# Patient Record
Sex: Male | Born: 1970 | Race: Black or African American | Hispanic: No | Marital: Single | Smoking: Former smoker
Health system: Southern US, Community
[De-identification: ages and names within clinical notes are randomized; demographics above are authoritative.]

## PROBLEM LIST (undated history)

## (undated) DIAGNOSIS — N401 Enlarged prostate with lower urinary tract symptoms: Secondary | ICD-10-CM

## (undated) DIAGNOSIS — K219 Gastro-esophageal reflux disease without esophagitis: Secondary | ICD-10-CM

## (undated) DIAGNOSIS — T7840XA Allergy, unspecified, initial encounter: Secondary | ICD-10-CM

## (undated) DIAGNOSIS — R0789 Other chest pain: Secondary | ICD-10-CM

## (undated) DIAGNOSIS — N138 Other obstructive and reflux uropathy: Secondary | ICD-10-CM

## (undated) HISTORY — DX: Benign prostatic hyperplasia with lower urinary tract symptoms: N40.1

## (undated) HISTORY — PX: HERNIA REPAIR: SHX51

## (undated) HISTORY — DX: Other obstructive and reflux uropathy: N13.8

## (undated) HISTORY — PX: TONSILLECTOMY: SUR1361

## (undated) HISTORY — DX: Other chest pain: R07.89

## (undated) HISTORY — DX: Gastro-esophageal reflux disease without esophagitis: K21.9

## (undated) HISTORY — DX: Allergy, unspecified, initial encounter: T78.40XA

---

## 2005-05-17 ENCOUNTER — Emergency Department: Payer: Self-pay | Admitting: Emergency Medicine

## 2005-05-17 ENCOUNTER — Other Ambulatory Visit: Payer: Self-pay

## 2005-11-16 ENCOUNTER — Emergency Department: Payer: Self-pay | Admitting: Emergency Medicine

## 2009-10-06 ENCOUNTER — Ambulatory Visit: Payer: Self-pay | Admitting: Internal Medicine

## 2009-10-06 DIAGNOSIS — N400 Enlarged prostate without lower urinary tract symptoms: Secondary | ICD-10-CM | POA: Insufficient documentation

## 2009-10-06 DIAGNOSIS — J309 Allergic rhinitis, unspecified: Secondary | ICD-10-CM | POA: Insufficient documentation

## 2009-10-06 DIAGNOSIS — K219 Gastro-esophageal reflux disease without esophagitis: Secondary | ICD-10-CM | POA: Insufficient documentation

## 2009-10-06 LAB — CONVERTED CEMR LAB
Basophils Relative: 0.3 % (ref 0.0–3.0)
CK-MB: 1 ng/mL (ref 0.3–4.0)
Eosinophils Relative: 1.4 % (ref 0.0–5.0)
HDL: 45.4 mg/dL (ref >39.00)
MCV: 95.5 fL (ref 78.0–100.0)
Monocytes Absolute: 0.6 10*3/uL (ref 0.1–1.0)
Monocytes Relative: 6.3 % (ref 3.0–12.0)
Neutrophils Relative %: 69.1 % (ref 43.0–77.0)
PSA: 0.53 ng/mL (ref 0.10–4.00)
Platelets: 188 10*3/uL (ref 150.0–400.0)
RBC: 4.85 M/uL (ref 4.22–5.81)
Total CHOL/HDL Ratio: 4
VLDL: 26 mg/dL (ref 0.0–40.0)
WBC: 9.8 10*3/uL (ref 4.5–10.5)

## 2009-10-11 ENCOUNTER — Ambulatory Visit: Payer: Self-pay | Admitting: Internal Medicine

## 2009-10-19 ENCOUNTER — Ambulatory Visit: Payer: Self-pay | Admitting: Internal Medicine

## 2009-10-25 ENCOUNTER — Telehealth: Payer: Self-pay | Admitting: Internal Medicine

## 2009-12-15 ENCOUNTER — Telehealth: Payer: Self-pay | Admitting: Internal Medicine

## 2010-01-24 ENCOUNTER — Ambulatory Visit: Payer: Self-pay | Admitting: Internal Medicine

## 2010-06-13 NOTE — Assessment & Plan Note (Signed)
Summary: 3 mos f/u #/cd   Vital Signs:  Patient profile:   40 year old male Height:      66 inches Weight:      197.50 pounds BMI:     31.99 O2 Sat:      97 % on Room air Temp:     97.7 degrees F oral Pulse rate:   64 / minute Pulse rhythm:   regular Resp:     16 per minute BP sitting:   112 / 70  (left arm)  Vitals Entered By: Rock Nephew CMA (January 24, 2010 10:17 AM)  Nutrition Counseling: Patient's BMI is greater than 25 and therefore counseled on weight management options.  O2 Flow:  Room air  Primary Care Provider:  Etta Grandchild MD   History of Present Illness: He feels well and returns for a f/up on GERD.  Dyspepsia History:      He has no alarm features of dyspepsia including no history of melena, hematochezia, dysphagia, persistent vomiting, or involuntary weight loss > 5%.  There is a prior history of GERD.  The patient does not have a prior history of documented ulcer disease.  The dominant symptom is heartburn or acid reflux.  An H-2 blocker medication is currently being taken.  He notes that the symptoms have improved with the H-2 blocker therapy.  Symptoms have not persisted after 4 weeks of H-2 blocker treatment.     Preventive Screening-Counseling & Management  Alcohol-Tobacco     Alcohol drinks/day: 2     Alcohol type: spirits     >5/day in last 3 mos: no     Alcohol Counseling: to decrease amount and/or frequency of alcohol intake     Feels need to cut down: no     Feels annoyed by complaints: no     Feels guilty re: drinking: no     Needs 'eye opener' in am: no     Smoking Status: quit < 6 months     Smoking Cessation Counseling: yes     Smoke Cessation Stage: quit     Packs/Day: 0.5     Year Started: 1997     Year Quit: 2011     Pack years: 10     Tobacco Counseling: to remain off tobacco products  Hep-HIV-STD-Contraception     Hepatitis Risk: no risk noted     HIV Risk: no risk noted     STD Risk: no risk noted     TSE monthly:  yes     Testicular SE Education/Counseling not indicated; STE done regularly  Clinical Review Panels:  CBC   WBC:  9.8 (10/06/2009)   RBC:  4.85 (10/06/2009)   Hgb:  15.6 (10/06/2009)   Hct:  46.3 (10/06/2009)   Platelets:  188.0 (10/06/2009)   MCV  95.5 (10/06/2009)   MCHC  33.7 (10/06/2009)   RDW  14.0 (10/06/2009)   PMN:  69.1 (10/06/2009)   Lymphs:  22.9 (10/06/2009)   Monos:  6.3 (10/06/2009)   Eosinophils:  1.4 (10/06/2009)   Basophil:  0.3 (10/06/2009)   Medications Prior to Update: 1)  Omeprazole 40 Mg Cpdr (Omeprazole) .... One By Mouth Once Daily  Current Medications (verified): 1)  Omeprazole 40 Mg Cpdr (Omeprazole) .... One By Mouth Once Daily  Allergies (verified): No Known Drug Allergies  Past History:  Past Medical History: Last updated: 10/06/2009 Allergic rhinitis GERD  Past Surgical History: Last updated: 10/06/2009 Inguinal herniorrhaphy Tonsillectomy  Family History: Last updated: 10/06/2009 Family  History of Colon CA 1st degree relative <60 Family History Diabetes 1st degree relative Family History Hypertension Family History of Stroke M 1st degree relative <50  Social History: Last updated: 10/06/2009 Occupation: Aeronautical engineer Single Alcohol use-yes Drug use-yes Regular exercise-yes  Risk Factors: Alcohol Use: 2 (01/24/2010) >5 drinks/d w/in last 3 months: no (01/24/2010) Exercise: yes (10/06/2009)  Risk Factors: Smoking Status: quit < 6 months (01/24/2010) Packs/Day: 0.5 (01/24/2010)  Family History: Reviewed history from 10/06/2009 and no changes required. Family History of Colon CA 1st degree relative <60 Family History Diabetes 1st degree relative Family History Hypertension Family History of Stroke M 1st degree relative <50  Social History: Reviewed history from 10/06/2009 and no changes required. Occupation: Aeronautical engineer Single Alcohol use-yes Drug use-yes Regular exercise-yes  Review of Systems  The  patient denies anorexia, chest pain, dyspnea on exertion, prolonged cough, abdominal pain, melena, hematochezia, severe indigestion/heartburn, suspicious skin lesions, and enlarged lymph nodes.    Physical Exam  General:  alert, well-developed, well-nourished, well-hydrated, appropriate dress, normal appearance, healthy-appearing, cooperative to examination, and good hygiene.   Mouth:  Oral mucosa and oropharynx without lesions or exudates.  Teeth in good repair. Neck:  supple, full ROM, no masses, no thyromegaly, no thyroid nodules or tenderness, no JVD, normal carotid upstroke, no carotid bruits, no cervical lymphadenopathy, and no neck tenderness.   Lungs:  Normal respiratory effort, chest expands symmetrically. Lungs are clear to auscultation, no crackles or wheezes. Heart:  Normal rate and regular rhythm. S1 and S2 normal without gallop, murmur, click, rub or other extra sounds. Abdomen:  soft, non-tender, normal bowel sounds, no distention, no masses, no guarding, no rigidity, no rebound tenderness, no abdominal hernia, no inguinal hernia, no hepatomegaly, and no splenomegaly.   Msk:  normal ROM, no joint tenderness, no joint swelling, no joint warmth, no redness over joints, no joint deformities, no joint instability, no crepitation, and no muscle atrophy.   Pulses:  R and L carotid,radial,femoral,dorsalis pedis and posterior tibial pulses are full and equal bilaterally Extremities:  No clubbing, cyanosis, edema, or deformity noted with normal full range of motion of all joints.   Neurologic:  No cranial nerve deficits noted. Station and gait are normal. Plantar reflexes are down-going bilaterally. DTRs are symmetrical throughout. Sensory, motor and coordinative functions appear intact. Skin:  turgor normal, color normal, no rashes, no suspicious lesions, no ecchymoses, no petechiae, no purpura, no ulcerations, no edema, and tattoo(s).   Cervical Nodes:  no anterior cervical adenopathy and no  posterior cervical adenopathy.   Psych:  Cognition and judgment appear intact. Alert and cooperative with normal attention span and concentration. No apparent delusions, illusions, hallucinations   Impression & Recommendations:  Problem # 1:  GERD (ICD-530.81) Assessment Improved  His updated medication list for this problem includes:    Omeprazole 40 Mg Cpdr (Omeprazole) ..... One by mouth once daily  Labs Reviewed: Hgb: 15.6 (10/06/2009)   Hct: 46.3 (10/06/2009)  Complete Medication List: 1)  Omeprazole 40 Mg Cpdr (Omeprazole) .... One by mouth once daily  Patient Instructions: 1)  Please schedule a follow-up appointment in 6 months. 2)  Avoid foods high in acid (tomatoes, citrus juices, spicy foods). Avoid eating within two hours of lying down or before exercising. Do not over eat; try smaller more frequent meals. Elevate head of bed twelve inches when sleeping.

## 2010-06-13 NOTE — Assessment & Plan Note (Signed)
Summary: NEW / [employer] / # / CD   Vital Signs:  Patient profile:   40 year old male Height:      66 inches Weight:      177 pounds BMI:     28.67 O2 Sat:      97 % on Room air Temp:     97.3 degrees F oral Pulse rate:   57 / minute Pulse rhythm:   regular Resp:     16 per minute BP sitting:   120 / 64  (left arm) Cuff size:   large  Vitals Entered By: Rock Nephew CMA (Oct 06, 2009 10:19 AM)  Nutrition Counseling: Patient's BMI is greater than 25 and therefore counseled on weight management options.  O2 Flow:  Room air  Primary Care Provider:  Etta Grandchild MD   History of Present Illness:       This is a 40 year old male who presents with Chest pain.  The symptoms began 3 weeks ago.  On a scale of 1 to 10, the intensity is described as a 3.  The patient reports resting chest pain, dizziness, and indigestion, but denies exertional chest pain, nausea, vomiting, diaphoresis, shortness of breath, palpitations, light headedness, and syncope.  The pain is described as intermittent and dull.  The pain is located in the substernal area and the pain does not radiate.  Episodes of chest pain last 20-30 minutes.  The pain is relieved or improved with H2-blockers and NSAIDs.  He went to the ER at [facility] and had negative enzymes and a normal stress echo on week ago. They prescribed Zantac and Motrin.  Dyspepsia History:      He has no alarm features of dyspepsia including no history of melena, hematochezia, dysphagia, persistent vomiting, or involuntary weight loss > 5%.  There is a prior history of GERD.  The patient does not have a prior history of documented ulcer disease.  The dominant symptom is heartburn or acid reflux.  An H-2 blocker medication is currently being taken.  He notes that the symptoms have not improved with the H-2 blocker therapy.     Preventive Screening-Counseling & Management  Alcohol-Tobacco     Alcohol drinks/day: 2     Alcohol type: spirits     >5/day in  last 3 mos: no     Alcohol Counseling: to decrease amount and/or frequency of alcohol intake     Feels need to cut down: no     Feels annoyed by complaints: no     Feels guilty re: drinking: no     Needs 'eye opener' in am: no     Smoking Status: quit < 6 months     Smoking Cessation Counseling: yes     Smoke Cessation Stage: quit     Packs/Day: 0.5     Year Started: 1997     Year Quit: 2011     Pack years: 10     Tobacco Counseling: to remain off tobacco products  Caffeine-Diet-Exercise     Does Patient Exercise: yes  Hep-HIV-STD-Contraception     Hepatitis Risk: no risk noted     HIV Risk: no risk noted     STD Risk: no risk noted     TSE monthly: yes     Testicular SE Education/Counseling not indicated; STE done regularly  Safety-Violence-Falls     Seat Belt Use: yes     Helmet Use: yes     Firearms in the Home: no  firearms in the home     Smoke Detectors: yes     Violence in the Home: no risk noted     Sexual Abuse: no      Sexual History:  currently monogamous.        Drug Use:  former, marijuana, and yes.        Blood Transfusions:  no.    Current Medications (verified): 1)  Ranitidine Hcl 150 Mg Caps (Ranitidine Hcl) .Marland Kitchen.. 1 Every 12hrs  Allergies (verified): No Known Drug Allergies  Past History:  Past Medical History: Allergic rhinitis GERD  Past Surgical History: Inguinal herniorrhaphy Tonsillectomy  Family History: Family History of Colon CA 1st degree relative <60 Family History Diabetes 1st degree relative Family History Hypertension Family History of Stroke M 1st degree relative <50  Social History: Occupation: Aeronautical engineer Single Alcohol use-yes Drug use-yes Regular exercise-yes Drug Use:  former, marijuana, yes Does Patient Exercise:  yes Smoking Status:  quit < 6 months Packs/Day:  0.5 Hepatitis Risk:  no risk noted HIV Risk:  no risk noted STD Risk:  no risk noted Seat Belt Use:  yes Sexual History:  currently  monogamous Blood Transfusions:  no  Review of Systems       The patient complains of chest pain and severe indigestion/heartburn.  The patient denies anorexia, fever, weight loss, weight gain, syncope, dyspnea on exertion, peripheral edema, prolonged cough, headaches, hemoptysis, abdominal pain, melena, hematochezia, hematuria, suspicious skin lesions, depression, enlarged lymph nodes, and angioedema.   GI:  Denies abdominal pain, bloody stools, change in bowel habits, dark tarry stools, diarrhea, indigestion, loss of appetite, nausea, vomiting, vomiting blood, and yellowish skin color. GU:  Denies decreased libido, discharge, dysuria, erectile dysfunction, hematuria, incontinence, nocturia, urinary frequency, and urinary hesitancy. Heme:  Denies abnormal bruising, bleeding, enlarge lymph nodes, fevers, pallor, and skin discoloration.  Physical Exam  General:  alert, well-developed, well-nourished, well-hydrated, appropriate dress, normal appearance, healthy-appearing, cooperative to examination, and good hygiene.   Head:  normocephalic, atraumatic, no abnormalities observed, and no abnormalities palpated.   Eyes:  vision grossly intact, pupils equal, pupils round, and pupils reactive to light.   Mouth:  Oral mucosa and oropharynx without lesions or exudates.  Teeth in good repair. Neck:  supple, full ROM, no masses, no thyromegaly, no thyroid nodules or tenderness, no JVD, normal carotid upstroke, no carotid bruits, no cervical lymphadenopathy, and no neck tenderness.   Chest Wall:  No deformities, masses, tenderness or gynecomastia noted. Breasts:  No masses or gynecomastia noted Lungs:  Normal respiratory effort, chest expands symmetrically. Lungs are clear to auscultation, no crackles or wheezes. Heart:  Normal rate and regular rhythm. S1 and S2 normal without gallop, murmur, click, rub or other extra sounds. Abdomen:  soft, non-tender, normal bowel sounds, no distention, no masses, no  guarding, no rigidity, no rebound tenderness, no abdominal hernia, no inguinal hernia, no hepatomegaly, and no splenomegaly.   Rectal:  No external abnormalities noted. Normal sphincter tone. No rectal masses or tenderness. Genitalia:  circumcised, no hydrocele, no varicocele, no scrotal masses, no testicular masses or atrophy, no cutaneous lesions, and no urethral discharge.   Prostate:  no nodules, no induration, 2+ enlarged, and L asymmetrical enlargement.   Msk:  normal ROM, no joint tenderness, no joint swelling, no joint warmth, no redness over joints, no joint deformities, no joint instability, no crepitation, and no muscle atrophy.   Pulses:  R and L carotid,radial,femoral,dorsalis pedis and posterior tibial pulses are full and equal bilaterally  Extremities:  No clubbing, cyanosis, edema, or deformity noted with normal full range of motion of all joints.   Neurologic:  No cranial nerve deficits noted. Station and gait are normal. Plantar reflexes are down-going bilaterally. DTRs are symmetrical throughout. Sensory, motor and coordinative functions appear intact. Skin:  turgor normal, color normal, no rashes, no suspicious lesions, no ecchymoses, no petechiae, no purpura, no ulcerations, no edema, and tattoo(s).   Cervical Nodes:  no anterior cervical adenopathy and no posterior cervical adenopathy.   Axillary Nodes:  no R axillary adenopathy and no L axillary adenopathy.   Inguinal Nodes:  no R inguinal adenopathy and no L inguinal adenopathy.   Psych:  Cognition and judgment appear intact. Alert and cooperative with normal attention span and concentration. No apparent delusions, illusions, hallucinations Additional Exam:  EKG shows NSR with ?LAE and early repol. in V1-V6 and conduction delay in Inf. leads.   Impression & Recommendations:  Problem # 1:  GERD (ICD-530.81) Assessment Deteriorated I asked him to stop nsaids, he requested FMLA so those forms were completed today (see scanned  documents) The following medications were removed from the medication list:    Ranitidine Hcl 150 Mg Caps (Ranitidine hcl) .Marland Kitchen... 1 every 12hrs His updated medication list for this problem includes:    Dexilant 60 Mg Cpdr (Dexlansoprazole) ..... One by mouth once daily for acid reflux  Orders: Venipuncture (16109) TLB-Lipid Panel (80061-LIPID) TLB-CBC Platelet - w/Differential (85025-CBCD) TLB-CK-MB (Creatine Kinase MB) (82553-CKMB) TLB-PSA (Prostate Specific Antigen) (84153-PSA) Hemoccult Guaiac-1 spec.(in office) (82270)  Problem # 2:  CHEST PAIN (ICD-786.50) Assessment: New I have reviewed the records from [facility] and I don't think his pain in cardiac in ight of benign EKG findings and normal stress Echo, will check a CK-MB for reassurance Orders: Venipuncture (60454) TLB-Lipid Panel (80061-LIPID) TLB-CBC Platelet - w/Differential (85025-CBCD) TLB-CK-MB (Creatine Kinase MB) (82553-CKMB) TLB-PSA (Prostate Specific Antigen) (84153-PSA) EKG w/ Interpretation (93000) Hemoccult Guaiac-1 spec.(in office) (82270)  Problem # 3:  CBC, ABNORMAL (ICD-790.99) Assessment: New at [facility] he had a CBC that showed slightly high WBC Orders: Venipuncture (09811) TLB-Lipid Panel (80061-LIPID) TLB-CBC Platelet - w/Differential (85025-CBCD) TLB-CK-MB (Creatine Kinase MB) (82553-CKMB) TLB-PSA (Prostate Specific Antigen) (84153-PSA) Hemoccult Guaiac-1 spec.(in office) (82270)  Problem # 4:  HYPERTROPHY PROSTATE W/UR OBST & OTH LUTS (ICD-600.01) Assessment: New  Orders: Venipuncture (91478) TLB-Lipid Panel (80061-LIPID) TLB-CBC Platelet - w/Differential (85025-CBCD) TLB-CK-MB (Creatine Kinase MB) (82553-CKMB) TLB-PSA (Prostate Specific Antigen) (84153-PSA)  Complete Medication List: 1)  Dexilant 60 Mg Cpdr (Dexlansoprazole) .... One by mouth once daily for acid reflux  Patient Instructions: 1)  Please schedule a follow-up appointment in 2 weeks. 2)  Avoid foods high in acid (tomatoes,  citrus juices, spicy foods). Avoid eating within two hours of lying down or before exercising. Do not over eat; try smaller more frequent meals. Elevate head of bed twelve inches when sleeping. 3)  Tobacco is very bad for your health and your loved ones! You Should stop smoking!. 4)  Stop Smoking Tips: Choose a Quit date. Cut down before the Quit date. decide what you will do as a substitute when you feel the urge to smoke(gum,toothpick,exercise). 5)  It is important that you exercise regularly at least 20 minutes 5 times a week. If you develop chest pain, have severe difficulty breathing, or feel very tired , stop exercising immediately and seek medical attention. 6)  You need to lose weight. Consider a lower calorie diet and regular exercise.  Prescriptions: DEXILANT 60 MG CPDR (DEXLANSOPRAZOLE) One  by mouth once daily for acid reflux  #25 x 0   Entered and Authorized by:   Etta Grandchild MD   Signed by:   Etta Grandchild MD on 10/06/2009   Method used:   Samples Given   RxID:   [id number]     Tetanus/Td Immunization History:    Tetanus/Td # 1:  Tdap (10/14/2008)

## 2010-06-13 NOTE — Assessment & Plan Note (Signed)
Summary: 2 WK FU/PN   Vital Signs:  Patient profile:   40 year old male Height:      66 inches Weight:      184 pounds BMI:     29.81 O2 Sat:      98 % on Room air Temp:     98.0 degrees F oral Pulse rate:   65 / minute Pulse rhythm:   regular Resp:     16 per minute BP sitting:   110 / 70  (left arm) Cuff size:   large  Vitals Entered By: Rock Nephew CMA (October 19, 2009 9:52 AM)  Nutrition Counseling: Patient's BMI is greater than 25 and therefore counseled on weight management options.  O2 Flow:  Room air CC: follow-up visit, Heartburn Is Patient Diabetic? No   Primary Care Provider:  Etta Grandchild MD  CC:  follow-up visit and Heartburn.  History of Present Illness:  Heartburn      This is a 40 year old man who presents with Heartburn.  The patient denies acid reflux, sour taste in mouth, epigastric pain, chest pain, trouble swallowing, weight loss, and weight gain.  The patient denies the following alarm features: melena, dysphagia, hematemesis, vomiting, involuntary weight loss >5%, and history of anemia.  Symptoms are worse with spicy foods.  Treatment that was tried and either found to be ineffective or stopped due to problems include dietary changes, weight loss, and an H2 blocker.    Preventive Screening-Counseling & Management  Alcohol-Tobacco     Alcohol drinks/day: 2     Alcohol type: spirits     >5/day in last 3 mos: no     Alcohol Counseling: to decrease amount and/or frequency of alcohol intake     Feels need to cut down: no     Feels annoyed by complaints: no     Feels guilty re: drinking: no     Needs 'eye opener' in am: no     Smoking Status: quit < 6 months     Smoking Cessation Counseling: yes     Smoke Cessation Stage: quit     Packs/Day: 0.5     Year Started: 1997     Year Quit: 2011     Pack years: 10     Tobacco Counseling: to remain off tobacco products  Hep-HIV-STD-Contraception     Hepatitis Risk: no risk noted     HIV Risk: no  risk noted     STD Risk: no risk noted     TSE monthly: yes     Testicular SE Education/Counseling not indicated; STE done regularly      Sexual History:  currently monogamous.        Drug Use:  former, marijuana, and yes.        Blood Transfusions:  no.    Medications Prior to Update: 1)  Dexilant 60 Mg Cpdr (Dexlansoprazole) .... One By Mouth Once Daily For Acid Reflux  Current Medications (verified): 1)  Dexilant 60 Mg Cpdr (Dexlansoprazole) .... One By Mouth Once Daily For Acid Reflux  Allergies (verified): No Known Drug Allergies  Past History:  Past Medical History: Last updated: 10/06/2009 Allergic rhinitis GERD  Past Surgical History: Last updated: 10/06/2009 Inguinal herniorrhaphy Tonsillectomy  Family History: Last updated: 10/06/2009 Family History of Colon CA 1st degree relative <60 Family History Diabetes 1st degree relative Family History Hypertension Family History of Stroke M 1st degree relative <50  Social History: Last updated: 10/06/2009 Occupation: Aeronautical engineer Single  Alcohol use-yes Drug use-yes Regular exercise-yes  Risk Factors: Alcohol Use: 2 (10/19/2009) >5 drinks/d w/in last 3 months: no (10/19/2009) Exercise: yes (10/06/2009)  Risk Factors: Smoking Status: quit < 6 months (10/19/2009) Packs/Day: 0.5 (10/19/2009)  Family History: Reviewed history from 10/06/2009 and no changes required. Family History of Colon CA 1st degree relative <60 Family History Diabetes 1st degree relative Family History Hypertension Family History of Stroke M 1st degree relative <50  Social History: Reviewed history from 10/06/2009 and no changes required. Occupation: Aeronautical engineer Single Alcohol use-yes Drug use-yes Regular exercise-yes  Review of Systems  The patient denies anorexia, fever, weight loss, weight gain, chest pain, syncope, dyspnea on exertion, prolonged cough, headaches, hemoptysis, abdominal pain, melena, hematochezia,  severe indigestion/heartburn, difficulty walking, depression, enlarged lymph nodes, and angioedema.    Physical Exam  General:  alert, well-developed, well-nourished, well-hydrated, appropriate dress, normal appearance, healthy-appearing, cooperative to examination, and good hygiene.   Eyes:  vision grossly intact, pupils equal, pupils round, and pupils reactive to light.   Mouth:  Oral mucosa and oropharynx without lesions or exudates.  Teeth in good repair. Neck:  supple, full ROM, no masses, no thyromegaly, no thyroid nodules or tenderness, no JVD, normal carotid upstroke, no carotid bruits, no cervical lymphadenopathy, and no neck tenderness.   Lungs:  Normal respiratory effort, chest expands symmetrically. Lungs are clear to auscultation, no crackles or wheezes. Heart:  Normal rate and regular rhythm. S1 and S2 normal without gallop, murmur, click, rub or other extra sounds. Abdomen:  soft, non-tender, normal bowel sounds, no distention, no masses, no guarding, no rigidity, no rebound tenderness, no abdominal hernia, no inguinal hernia, no hepatomegaly, and no splenomegaly.   Msk:  normal ROM, no joint tenderness, no joint swelling, no joint warmth, no redness over joints, no joint deformities, no joint instability, no crepitation, and no muscle atrophy.   Pulses:  R and L carotid,radial,femoral,dorsalis pedis and posterior tibial pulses are full and equal bilaterally Extremities:  No clubbing, cyanosis, edema, or deformity noted with normal full range of motion of all joints.   Neurologic:  No cranial nerve deficits noted. Station and gait are normal. Plantar reflexes are down-going bilaterally. DTRs are symmetrical throughout. Sensory, motor and coordinative functions appear intact. Skin:  turgor normal, color normal, no rashes, no suspicious lesions, no ecchymoses, no petechiae, no purpura, no ulcerations, no edema, and tattoo(s).   Cervical Nodes:  no anterior cervical adenopathy and no  posterior cervical adenopathy.   Psych:  Cognition and judgment appear intact. Alert and cooperative with normal attention span and concentration. No apparent delusions, illusions, hallucinations   Impression & Recommendations:  Problem # 1:  GERD (ICD-530.81) Assessment Improved  His updated medication list for this problem includes:    Dexilant 60 Mg Cpdr (Dexlansoprazole) ..... One by mouth once daily for acid reflux  Labs Reviewed: Hgb: 15.6 (10/06/2009)   Hct: 46.3 (10/06/2009)  Problem # 2:  CBC, ABNORMAL (ICD-790.99) Assessment: Improved  Problem # 3:  CHEST PAIN (ICD-786.50) Assessment: Improved  Complete Medication List: 1)  Dexilant 60 Mg Cpdr (Dexlansoprazole) .... One by mouth once daily for acid reflux  Patient Instructions: 1)  Please schedule a follow-up appointment in 3 months. 2)  It is important that you exercise regularly at least 20 minutes 5 times a week. If you develop chest pain, have severe difficulty breathing, or feel very tired , stop exercising immediately and seek medical attention. 3)  You need to lose weight. Consider a lower calorie diet  and regular exercise.

## 2010-06-13 NOTE — Progress Notes (Signed)
Summary: REQ FOR RX  Phone Note Call from Patient Call back at Home Phone [phone]   Summary of Call: Patient is requesting rx for "stronger" med than dexilant. Pt says med helps but continues to have symptoms.  Initial call taken by: Lamar Sprinkles, CMA,  October 25, 2009 2:52 PM  Follow-up for Phone Call        done Follow-up by: Etta Grandchild MD,  October 26, 2009 7:14 AM  Additional Follow-up for Phone Call Additional follow up Details #1::        Pt informed  Additional Follow-up by: Lamar Sprinkles, CMA,  October 26, 2009 10:12 AM    New/Updated Medications: ACIPHEX 20 MG TBEC (RABEPRAZOLE SODIUM) One by mouth once daily Prescriptions: ACIPHEX 20 MG TBEC (RABEPRAZOLE SODIUM) One by mouth once daily  #30 x 11   Entered and Authorized by:   Etta Grandchild MD   Signed by:   Etta Grandchild MD on 10/26/2009   Method used:   Electronically to        [facility] [phone]* (retail)       [address] [place], [place]  [address]       Ph: [phone] or [phone]       Fax: [phone]   RxID:   [id number]   Appended Document: REQ FOR 90day supply    Prescriptions: ACIPHEX 20 MG TBEC (RABEPRAZOLE SODIUM) One by mouth once daily  #90 x 3   Entered by:   Rock Nephew CMA   Authorized by:   Etta Grandchild MD   Signed by:   Rock Nephew CMA on 12/13/2009   Method used:   Electronically to        [facility] [phone]* (retail)       [address]       [place], [place]  [address]       Ph: [phone] or [phone]       Fax: [phone]   RxID:   [id number]  [facility] request approval for 90day supply per pt request/la

## 2010-06-13 NOTE — Progress Notes (Signed)
Summary: alternative  Phone Note From Pharmacy   [facility]er: [facility]  [address] [phone]* Reason for Call: Medication not on formulary Summary of Call: Per fax from pharmacy, pt is requesting a lower cost alternative to Aciphex 20mg . The alternatives are pantoprazole, omeprazole, or lansoprazole. Please advise thanks.Marland KitchenMarland KitchenAlvy Beal Archie CMA  December 15, 2009 9:46 AM     New/Updated Medications: OMEPRAZOLE 40 MG CPDR (OMEPRAZOLE) One by mouth once daily Prescriptions: OMEPRAZOLE 40 MG CPDR (OMEPRAZOLE) One by mouth once daily  #30 x 11   Entered and Authorized by:   Etta Grandchild MD   Signed by:   Etta Grandchild MD on 12/15/2009   Method used:   Electronically to        [facility] [phone]* (retail)       [address] [place], [place]  [address]       Ph: [phone] or [phone]       Fax: [phone]   RxID:   [id number]

## 2010-07-25 ENCOUNTER — Ambulatory Visit: Payer: Self-pay | Admitting: Internal Medicine

## 2010-08-01 ENCOUNTER — Ambulatory Visit: Payer: Self-pay | Admitting: Internal Medicine

## 2010-08-07 ENCOUNTER — Encounter: Payer: Self-pay | Admitting: Internal Medicine

## 2010-08-07 ENCOUNTER — Ambulatory Visit (INDEPENDENT_AMBULATORY_CARE_PROVIDER_SITE_OTHER): Payer: BC Managed Care – PPO | Admitting: Internal Medicine

## 2010-08-07 VITALS — BP 122/80 | HR 65 | Temp 98.3°F | Ht 66.0 in | Wt 210.0 lb

## 2010-08-07 DIAGNOSIS — K219 Gastro-esophageal reflux disease without esophagitis: Secondary | ICD-10-CM

## 2010-08-07 DIAGNOSIS — J309 Allergic rhinitis, unspecified: Secondary | ICD-10-CM

## 2010-08-07 MED ORDER — RABEPRAZOLE SODIUM 20 MG PO TBEC: 20.0000 mg | DELAYED_RELEASE_TABLET | Freq: Every day | ORAL | Status: DC

## 2010-08-07 NOTE — Progress Notes (Signed)
  Subjective:    Patient ID: Jacob Cross, male    DOB: 1971-03-19, 40 y.o.   MRN: [id number]  Gastrophageal Reflux He complains of heartburn. He reports no abdominal pain, no belching, no chest pain, no choking, no coughing, no dysphagia, no early satiety, no globus sensation, no hoarse voice, no nausea, no sore throat, no stridor, no tooth decay, no water brash or no wheezing. This is a chronic problem. The current episode started more than 1 year ago. The problem occurs occasionally. The problem has been gradually improving. The heartburn duration is several minutes. The heartburn is located in the substernum. The heartburn is of mild intensity. The heartburn does not wake him from sleep. The heartburn does not limit his activity. The heartburn doesn't change with position. The symptoms are aggravated by certain foods. Pertinent negatives include no anemia, fatigue, melena, muscle weakness, orthopnea or weight loss. Risk factors include no known risk factors. He has tried a PPI for the symptoms. The treatment provided significant relief.      Review of Systems  Constitutional: Negative for fever, weight loss, activity change, appetite change, fatigue and unexpected weight change.  HENT: Negative for sore throat and hoarse voice.   Respiratory: Negative for cough, choking and wheezing.   Cardiovascular: Negative for chest pain.  Gastrointestinal: Positive for heartburn. Negative for dysphagia, nausea, abdominal pain, diarrhea, constipation, blood in stool, melena and abdominal distention.  Musculoskeletal: Negative for muscle weakness.       Objective:   Physical Exam  Constitutional: He appears well-developed and well-nourished. No distress.  HENT:  Head: Normocephalic and atraumatic.  Right Ear: External ear normal.  Left Ear: External ear normal.  Nose: Nose normal.  Mouth/Throat: Oropharynx is clear and moist. No oropharyngeal exudate.  Eyes: Conjunctivae and EOM are normal. Pupils  are equal, round, and reactive to light. Right eye exhibits no discharge. Left eye exhibits no discharge. No scleral icterus.  Neck: Normal range of motion. Neck supple. No JVD present. No tracheal deviation present. No thyromegaly present.  Cardiovascular: Normal rate, regular rhythm, normal heart sounds and intact distal pulses.  Exam reveals no gallop and no friction rub.   No murmur heard. Pulmonary/Chest: Effort normal and breath sounds normal. No stridor. No respiratory distress. He has no wheezes. He has no rales. He exhibits no tenderness.  Abdominal: Soft. Bowel sounds are normal. He exhibits no distension. There is no tenderness. There is no rebound and no guarding.  Musculoskeletal: He exhibits no edema and no tenderness.  Lymphadenopathy:    He has no cervical adenopathy.  Neurological: He is alert. He has normal reflexes.  Skin: Skin is warm and dry. No rash noted. He is not diaphoretic. No erythema. No pallor.  Psychiatric: He has a normal mood and affect. His behavior is normal. Judgment and thought content normal.          Assessment & Plan:

## 2010-08-07 NOTE — Assessment & Plan Note (Signed)
He is doing well on PPI so will continue, he will lose weight and avoid certain foods

## 2010-08-07 NOTE — Assessment & Plan Note (Signed)
Continue current meds 

## 2010-08-07 NOTE — Patient Instructions (Signed)
Esophagitis (Heartburn) Esophagitis (heartburn) is a painful, burning sensation in the chest. It may feel worse in certain positions, such as lying down or bending over. It is caused by stomach acid backing up into the tube that carries food from the mouth down to the stomach (lower esophagus). TREATMENT There are a number of non-prescription medicines used to treat heartburn, including:  Antacids.   Acid reducers (also called H-2 blockers).   Proton-pump inhibitors.  HOME CARE INSTRUCTIONS  Raise the head of your bead by putting blocks under the legs.   Eat 2-3 hours before going to bed.   Stop smoking.   Try to reach and maintain a healthy weight.   Do not eat just a few very large meals. Instead, eat many smaller meals throughout the day.   Try to identify foods and beverages that make your symptoms worse, and avoid these.   Avoid tight clothing.   Do not exercise right after eating.  SEEK IMMEDIATE MEDICAL CARE IF YOU:  Have severe chest pain that goes down your arm, or into your jaw or neck.   Feel sweaty, dizzy, or lightheaded.   Are short of breath.   Throw up (vomit) blood.   Have difficulty or pain with swallowing.   Have bloody or black, tarry stools.   Have bouts of heartburn more than three times a week for more than two weeks.  Document Released: 06/07/2004 Document Re-Released: 07/25/2009 Exit[employer] Patient Information 2011 [employer]. 

## 2010-12-21 ENCOUNTER — Other Ambulatory Visit: Payer: Self-pay | Admitting: Internal Medicine

## 2011-02-15 ENCOUNTER — Encounter: Payer: Self-pay | Admitting: Internal Medicine

## 2011-02-15 ENCOUNTER — Ambulatory Visit (INDEPENDENT_AMBULATORY_CARE_PROVIDER_SITE_OTHER): Payer: BC Managed Care – PPO | Admitting: Internal Medicine

## 2011-02-15 VITALS — BP 118/80 | HR 74 | Temp 98.1°F | Resp 16 | Wt 206.0 lb

## 2011-02-15 DIAGNOSIS — R079 Chest pain, unspecified: Secondary | ICD-10-CM | POA: Insufficient documentation

## 2011-02-15 DIAGNOSIS — K219 Gastro-esophageal reflux disease without esophagitis: Secondary | ICD-10-CM

## 2011-02-15 MED ORDER — ESOMEPRAZOLE MAGNESIUM 40 MG PO CPDR: 40.0000 mg | DELAYED_RELEASE_CAPSULE | Freq: Two times a day (BID) | ORAL | Status: DC

## 2011-02-15 NOTE — Patient Instructions (Signed)
Chest Pain (Nonspecific) It is often hard to give a specific diagnosis for the cause of chest pain. There is always a chance that your pain could be related to something serious, such as a heart attack or a blood clot in the lungs. You need to follow up with your caregiver for further evaluation. CAUSES  Heartburn.   Pneumonia or bronchitis.   Anxiety and stress.   Inflammation around your heart (pericarditis) or lung (pleuritis or pleurisy).   A blood clot in the lung.   A collapsed lung (pneumothorax). It can develop suddenly on its own (spontaneous pneumothorax) or from injury (trauma) to the chest.  The chest wall is composed of bones, muscles, and cartilage. Any of these can be the source of the pain.  The bones can be bruised by injury.   The muscles or cartilage can be strained by coughing or overwork.   The cartilage can be affected by inflammation and become sore (costochondritis).  DIAGNOSIS Lab tests or other studies, such as X-rays, an EKG, stress testing, or cardiac imaging, may be needed to find the cause of your pain.  TREATMENT  Treatment depends on what may be causing your chest pain. Treatment may include:   Acid blockers for heartburn.  Anti-inflammatory medicine.   Pain medicine for inflammatory conditions.  Antibiotics if an infection is present.    You may be advised to change lifestyle habits. This includes stopping smoking and avoiding caffeine and chocolate.   You may be advised to keep your head raised (elevated) when sleeping. This reduces the chance of acid going backward from your stomach into your esophagus.   Most of the time, nonspecific chest pain will improve within 2 to 3 days with rest and mild pain medicine.  HOME CARE INSTRUCTIONS  If antibiotics were prescribed, take the full amount even if you start to feel better.   For the next few days, avoid physical activities that bring on chest pain. Continue physical activities as directed.     Do not smoke cigarettes or drink alcohol until your symptoms are gone.   Only take over-the-counter or prescription medicine for pain, discomfort, or fever as directed by your caregiver.   Follow your caregiver's suggestions for further testing if your chest pain does not go away.   Keep any follow-up appointments you made. If you do not go to an appointment, you could develop lasting (chronic) problems with pain. If there is any problem keeping an appointment, you must call to reschedule.  SEEK MEDICAL CARE IF:  You think you are having problems from the medicine you are taking. Read your medicine instructions carefully.   Your chest pain does not go away, even after treatment.   You develop a rash with blisters on your chest.  SEEK IMMEDIATE MEDICAL CARE IF:  You have increased chest pain or pain that spreads to your arm, neck, jaw, back, or belly (abdomen).   You develop shortness of breath, an increasing cough, or you are coughing up blood.   You have severe back or abdominal pain, feel sick to your stomach (nauseous) or throw up (vomit).   You develop severe weakness, fainting, or chills.   You have an oral temperature above 100.5, not controlled by medicine.  THIS IS AN EMERGENCY. Do not wait to see if the pain will go away. Get medical help at once. Call your local emergency services  (911 in [place] Do not drive yourself to the hospital. MAKE SURE YOU:  Understand these   instructions.   Will watch your condition.   Will get help right away if you are not doing well or get worse.  Document Released: 02/07/2005 Document Re-Released: 07/25/2009 ExitCare Patient Information 2011 [employer].Esophagitis (Heartburn) Esophagitis (heartburn) is a painful, burning sensation in the chest. It may feel worse in certain positions, such as lying down or bending over. It is caused by stomach acid backing up into the tube that carries food from the mouth down to the stomach (lower  esophagus). TREATMENT There are a number of non-prescription medicines used to treat heartburn, including:  Antacids.   Acid reducers (also called H-2 blockers).   Proton-pump inhibitors.  HOME CARE INSTRUCTIONS  Raise the head of your bead by putting blocks under the legs.   Eat 2-3 hours before going to bed.   Stop smoking.   Try to reach and maintain a healthy weight.   Do not eat just a few very large meals. Instead, eat many smaller meals throughout the day.   Try to identify foods and beverages that make your symptoms worse, and avoid these.   Avoid tight clothing.   Do not exercise right after eating.  SEEK IMMEDIATE MEDICAL CARE IF YOU:  Have severe chest pain that goes down your arm, or into your jaw or neck.   Feel sweaty, dizzy, or lightheaded.   Are short of breath.   Throw up (vomit) blood.   Have difficulty or pain with swallowing.   Have bloody or black, tarry stools.   Have bouts of heartburn more than three times a week for more than two weeks.  Document Released: 06/07/2004 Document Re-Released: 07/25/2009 Exit[employer] Patient Information 2011 [employer]. 

## 2011-02-18 NOTE — Assessment & Plan Note (Signed)
I will increase his PPI treatment to BID nexium

## 2011-02-18 NOTE — Progress Notes (Signed)
Subjective:    Patient ID: Jacob Cross, male    DOB: Sep 15, 1970, 40 y.o.   MRN: [id number]  Gastrophageal Reflux He complains of belching, chest pain and heartburn. He reports no abdominal pain, no choking, no coughing, no dysphagia, no early satiety, no globus sensation, no hoarse voice, no nausea, no sore throat, no stridor, no tooth decay, no water brash or no wheezing. This is a recurrent problem. The current episode started more than 1 year ago. The problem occurs frequently. The problem has been gradually worsening. The heartburn duration is an hour. The heartburn is located in the substernum. The heartburn is of mild intensity. The heartburn does not wake him from sleep. The heartburn does not limit his activity. The heartburn doesn't change with position. The symptoms are aggravated by nothing. Pertinent negatives include no anemia, fatigue, melena, muscle weakness, orthopnea or weight loss. He has tried a PPI for the symptoms. The treatment provided mild relief.  Chest Pain  This is a recurrent problem. The current episode started 1 to 4 weeks ago. The onset quality is gradual. The problem occurs intermittently. The problem has been unchanged. The pain is present in the substernal region. The pain is at a severity of 1/10. The quality of the pain is described as burning. The pain does not radiate. Pertinent negatives include no abdominal pain, back pain, claudication, cough, diaphoresis, dizziness, exertional chest pressure, fever, headaches, hemoptysis, irregular heartbeat, leg pain, lower extremity edema, malaise/fatigue, nausea, near-syncope, numbness, orthopnea, palpitations, PND, shortness of breath, sputum production, syncope, vomiting or weakness. The pain is aggravated by food. He has tried nothing for the symptoms.  Pertinent negatives for past medical history include no muscle weakness.      Review of Systems  Constitutional: Negative for fever, chills, weight loss, malaise/fatigue,  diaphoresis, activity change, appetite change, fatigue and unexpected weight change.  HENT: Negative.  Negative for sore throat and hoarse voice.   Eyes: Negative.   Respiratory: Negative for apnea, cough, hemoptysis, sputum production, choking, chest tightness, shortness of breath, wheezing and stridor.   Cardiovascular: Positive for chest pain. Negative for palpitations, orthopnea, claudication, leg swelling, syncope, PND and near-syncope.  Gastrointestinal: Positive for heartburn. Negative for dysphagia, nausea, vomiting, abdominal pain, diarrhea, blood in stool, melena, abdominal distention and anal bleeding.  Genitourinary: Negative.   Musculoskeletal: Negative for myalgias, back pain, joint swelling, arthralgias, gait problem and muscle weakness.  Skin: Negative for color change, pallor, rash and wound.  Neurological: Negative.  Negative for dizziness, weakness, numbness and headaches.  Hematological: Negative.  Negative for adenopathy. Does not bruise/bleed easily.  Psychiatric/Behavioral: Negative.        Objective:   Physical Exam  Vitals reviewed. Constitutional: He is oriented to person, place, and time. He appears well-developed and well-nourished. No distress.  HENT:  Mouth/Throat: Oropharynx is clear and moist. No oropharyngeal exudate.  Eyes: Conjunctivae are normal. Right eye exhibits no discharge. Left eye exhibits no discharge. No scleral icterus.  Neck: Normal range of motion. Neck supple. No JVD present. No tracheal deviation present. No thyromegaly present.  Cardiovascular: Normal rate, regular rhythm, normal heart sounds and intact distal pulses.  Exam reveals no gallop and no friction rub.   No murmur heard. Pulmonary/Chest: Effort normal and breath sounds normal. No stridor. No respiratory distress. He has no wheezes. He has no rales. He exhibits no tenderness.  Abdominal: Soft. Bowel sounds are normal. He exhibits no distension and no mass. There is no tenderness.  There is no rebound and  no guarding.  Musculoskeletal: Normal range of motion. He exhibits no edema and no tenderness.  Lymphadenopathy:    He has no cervical adenopathy.  Neurological: He is oriented to person, place, and time. He displays normal reflexes. No cranial nerve deficit. He exhibits normal muscle tone. Coordination normal.  Skin: Skin is warm and dry. No rash noted. He is not diaphoretic. No erythema. No pallor.  Psychiatric: He has a normal mood and affect. His behavior is normal. Judgment and thought content normal.          Assessment & Plan:

## 2011-02-18 NOTE — Assessment & Plan Note (Signed)
His EKG is normal and shows no change from prior EKG so I don't think this is cardiac but is due to his GERD

## 2011-03-08 ENCOUNTER — Ambulatory Visit: Payer: BC Managed Care – PPO | Admitting: Internal Medicine

## 2011-03-09 ENCOUNTER — Ambulatory Visit (INDEPENDENT_AMBULATORY_CARE_PROVIDER_SITE_OTHER): Payer: BC Managed Care – PPO | Admitting: Internal Medicine

## 2011-03-09 ENCOUNTER — Encounter: Payer: Self-pay | Admitting: Internal Medicine

## 2011-03-09 DIAGNOSIS — K219 Gastro-esophageal reflux disease without esophagitis: Secondary | ICD-10-CM

## 2011-03-09 NOTE — Patient Instructions (Signed)
Heartburn Heartburn is a painful, burning sensation in the chest. It may feel worse in certain positions, such as lying down or bending over. It is caused by stomach acid backing up into the tube that carries food from the mouth down to the stomach (lower esophagus).  CAUSES   Large meals.   Certain foods and drinks.   Exercise.   Increased acid production.   Being overweight or obese.   Certain medicines.  SYMPTOMS   Burning pain in the chest or lower throat.   Bitter taste in the mouth.   Coughing.  DIAGNOSIS  If the usual treatments for heartburn do not improve your symptoms, then tests may be done to see if there is another condition present. Possible tests may include:  X-rays.   Endoscopy. This is when a tube with a light and a camera on the end is used to examine the esophagus and the stomach.   A test to measure the amount of acid in the esophagus (pH test).   A test to see if the esophagus is working properly (esophageal manometry).   Blood, breath, or stool tests to check for bacteria that cause ulcers.  TREATMENT   Your caregiver may tell you to use certain over-the-counter medicines (antacids, acid reducers) for mild heartburn.   Your caregiver may prescribe medicines to decrease the acid in your stomach or protect your stomach lining.   Your caregiver may recommend certain diet changes.   For severe cases, your caregiver may recommend that the head of your bed be elevated on blocks. (Sleeping with more pillows is not an effective treatment as it only changes the position of your head and does not improve the main problem of stomach acid refluxing into the esophagus.)  HOME CARE INSTRUCTIONS   Take all medicines as directed by your caregiver.   Raise the head of your bed by putting blocks under the legs if instructed to by your caregiver.   Do not exercise right after eating.   Avoid eating 2 or 3 hours before bed. Do not lie down right after eating.    Eat small meals throughout the day instead of 3 large meals.   Stop smoking if you smoke.   Maintain a healthy weight.   Identify foods and beverages that make your symptoms worse and avoid them. Foods you may want to avoid include:   Peppers.   Chocolate.   High-fat foods, including fried foods.   Spicy foods.   Garlic and onions.   Citrus fruits, including oranges, grapefruit, lemons, and limes.   Food containing tomatoes or tomato products.   Mint.   Carbonated drinks, caffeinated drinks, and alcohol.   Vinegar.  SEEK IMMEDIATE MEDICAL CARE IF:  You have severe chest pain that goes down your arm or into your jaw or neck.   You feel sweaty, dizzy, or lightheaded.   You are short of breath.   You vomit blood.   You have difficulty or pain with swallowing.   You have bloody or black, tarry stools.   You have episodes of heartburn more than 3 times a week for more than 2 weeks.  MAKE SURE YOU:  Understand these instructions.   Will watch your condition.   Will get help right away if you are not doing well or get worse.  Document Released: 09/16/2008 Document Revised: 01/10/2011 Document Reviewed: 10/15/2010 [employer] Patient Information 2012 [employer]. 

## 2011-03-09 NOTE — Progress Notes (Signed)
  Subjective:    Patient ID: Jacob Cross, male    DOB: 1970/06/08, 40 y.o.   MRN: [id number]  Heartburn He complains of heartburn. He reports no abdominal pain, no belching, no chest pain, no choking, no coughing, no dysphagia, no early satiety, no globus sensation, no nausea, no sore throat, no stridor, no tooth decay or no wheezing. This is a chronic problem. The current episode started more than 1 year ago. The problem occurs rarely. The problem has been gradually improving. The heartburn duration is less than a minute. The heartburn is located in the substernum. The heartburn is of mild intensity. The heartburn does not wake him from sleep. The heartburn does not limit his activity. The heartburn doesn't change with position. The symptoms are aggravated by certain foods. Pertinent negatives include no anemia, fatigue, melena, muscle weakness, orthopnea or weight loss. He has tried a PPI for the symptoms. The treatment provided significant relief.      Review of Systems  Constitutional: Negative.  Negative for weight loss and fatigue.  HENT: Negative.  Negative for sore throat.   Eyes: Negative.   Respiratory: Negative for apnea, cough, choking, chest tightness, shortness of breath, wheezing and stridor.   Cardiovascular: Negative for chest pain, palpitations and leg swelling.  Gastrointestinal: Positive for heartburn. Negative for dysphagia, nausea, vomiting, abdominal pain, diarrhea, constipation, blood in stool and melena.  Genitourinary: Negative.   Musculoskeletal: Negative.  Negative for muscle weakness.  Skin: Negative for color change, pallor, rash and wound.  Neurological: Negative.   Hematological: Negative for adenopathy. Does not bruise/bleed easily.  Psychiatric/Behavioral: Negative.        Objective:   Physical Exam  Vitals reviewed. Constitutional: He is oriented to person, place, and time. He appears well-developed and well-nourished. No distress.  HENT:    Mouth/Throat: Oropharynx is clear and moist. No oropharyngeal exudate.  Eyes: Conjunctivae are normal. Right eye exhibits no discharge. Left eye exhibits no discharge. No scleral icterus.  Neck: Normal range of motion. Neck supple. No JVD present. No tracheal deviation present. No thyromegaly present.  Cardiovascular: Normal rate, regular rhythm, normal heart sounds and intact distal pulses.  Exam reveals no gallop and no friction rub.   No murmur heard. Pulmonary/Chest: Effort normal and breath sounds normal. No stridor. No respiratory distress. He has no wheezes. He has no rales. He exhibits no tenderness.  Abdominal: Soft. Bowel sounds are normal. He exhibits no distension and no mass. There is no tenderness. There is no rebound and no guarding.  Musculoskeletal: Normal range of motion. He exhibits no edema and no tenderness.  Lymphadenopathy:    He has no cervical adenopathy.  Neurological: He is oriented to person, place, and time. He displays normal reflexes. He exhibits normal muscle tone. Coordination normal.  Skin: Skin is warm and dry. No rash noted. He is not diaphoretic. No erythema. No pallor.  Psychiatric: He has a normal mood and affect. His behavior is normal. Judgment and thought content normal.          Lab Results  Component Value Date   WBC 9.8 10/06/2009   HGB 15.6 10/06/2009   HCT 46.3 10/06/2009   PLT 188.0 10/06/2009   CHOL 182 10/06/2009   TRIG 130.0 10/06/2009   HDL 45.40 10/06/2009   LDLCALC 111* 10/06/2009   PSA 0.53 10/06/2009   Assessment & Plan:

## 2011-03-09 NOTE — Assessment & Plan Note (Signed)
He is doing well on nexium so will continue it

## 2011-04-26 ENCOUNTER — Telehealth: Payer: Self-pay | Admitting: *Deleted

## 2011-04-26 DIAGNOSIS — R079 Chest pain, unspecified: Secondary | ICD-10-CM

## 2011-04-26 DIAGNOSIS — K219 Gastro-esophageal reflux disease without esophagitis: Secondary | ICD-10-CM

## 2011-04-26 MED ORDER — ESOMEPRAZOLE MAGNESIUM 40 MG PO CPDR: 40.0000 mg | DELAYED_RELEASE_CAPSULE | Freq: Two times a day (BID) | ORAL | Status: DC

## 2011-04-26 NOTE — Telephone Encounter (Signed)
Request for Nexium. Done.

## 2011-06-15 ENCOUNTER — Ambulatory Visit (INDEPENDENT_AMBULATORY_CARE_PROVIDER_SITE_OTHER): Payer: BC Managed Care – PPO | Admitting: Internal Medicine

## 2011-06-15 ENCOUNTER — Other Ambulatory Visit (INDEPENDENT_AMBULATORY_CARE_PROVIDER_SITE_OTHER): Payer: BC Managed Care – PPO

## 2011-06-15 ENCOUNTER — Encounter: Payer: Self-pay | Admitting: Internal Medicine

## 2011-06-15 ENCOUNTER — Ambulatory Visit (INDEPENDENT_AMBULATORY_CARE_PROVIDER_SITE_OTHER)
Admission: RE | Admit: 2011-06-15 | Discharge: 2011-06-15 | Disposition: A | Discharge status: Home / Self Care | Payer: BC Managed Care – PPO | Source: Ambulatory Visit | Attending: Internal Medicine | Admitting: Internal Medicine

## 2011-06-15 VITALS — BP 100/64 | HR 66 | Temp 98.3°F | Resp 16 | Wt 203.0 lb

## 2011-06-15 DIAGNOSIS — M79671 Pain in right foot: Secondary | ICD-10-CM | POA: Insufficient documentation

## 2011-06-15 DIAGNOSIS — M79609 Pain in unspecified limb: Secondary | ICD-10-CM

## 2011-06-15 DIAGNOSIS — Z0000 Encounter for general adult medical examination without abnormal findings: Secondary | ICD-10-CM | POA: Insufficient documentation

## 2011-06-15 DIAGNOSIS — N401 Enlarged prostate with lower urinary tract symptoms: Secondary | ICD-10-CM

## 2011-06-15 LAB — PSA: PSA: 0.51 ng/mL (ref 0.10–4.00)

## 2011-06-15 LAB — URINALYSIS, ROUTINE W REFLEX MICROSCOPIC
Nitrite: NEGATIVE
Specific Gravity, Urine: 1.02 (ref 1.000–1.030)
Total Protein, Urine: NEGATIVE
pH: 7 (ref 5.0–8.0)

## 2011-06-15 LAB — CBC WITH DIFFERENTIAL/PLATELET
Basophils Relative: 0.5 % (ref 0.0–3.0)
Eosinophils Absolute: 0.2 10*3/uL (ref 0.0–0.7)
HCT: 45.3 % (ref 39.0–52.0)
Hemoglobin: 15.1 g/dL (ref 13.0–17.0)
MCHC: 33.3 g/dL (ref 30.0–36.0)
MCV: 92.2 fl (ref 78.0–100.0)
Monocytes Absolute: 1 10*3/uL (ref 0.1–1.0)
Neutro Abs: 6.6 10*3/uL (ref 1.4–7.7)
RBC: 4.92 Mil/uL (ref 4.22–5.81)

## 2011-06-15 LAB — COMPREHENSIVE METABOLIC PANEL
AST: 19 U/L (ref 0–37)
Alkaline Phosphatase: 71 U/L (ref 39–117)
BUN: 16 mg/dL (ref 6–23)
Calcium: 9.2 mg/dL (ref 8.4–10.5)
Creatinine, Ser: 1 mg/dL (ref 0.4–1.5)

## 2011-06-15 LAB — LIPID PANEL
Cholesterol: 193 mg/dL (ref 0–200)
VLDL: 21.8 mg/dL (ref 0.0–40.0)

## 2011-06-15 LAB — TSH: TSH: 1.22 u[IU]/mL (ref 0.35–5.50)

## 2011-06-15 NOTE — Assessment & Plan Note (Signed)
Exam done, labs ordered, pt ed material was given 

## 2011-06-15 NOTE — Progress Notes (Signed)
Subjective:    Patient ID: Jacob Cross, male    DOB: [birth year], 41 y.o.   MRN: [id number]  HPI He returns for a complete physical but he also complains of nontraumatic right foot pain for one week. He has not seen and swelling and he can bear weight on the right foot without difficulty. He has not taken anything for pain and he does not want anything for pain.   Review of Systems  Constitutional: Negative.   HENT: Negative.   Eyes: Negative.   Respiratory: Negative.   Cardiovascular: Negative.   Gastrointestinal: Negative.   Genitourinary: Negative.   Musculoskeletal: Positive for arthralgias (right foot). Negative for myalgias, back pain, joint swelling and gait problem.  Skin: Negative.   Neurological: Negative.   Hematological: Negative.   Psychiatric/Behavioral: Negative.        Objective:   Physical Exam  Vitals reviewed. Constitutional: He is oriented to person, place, and time. He appears well-developed and well-nourished. No distress.  HENT:  Head: Normocephalic and atraumatic.  Mouth/Throat: Oropharynx is clear and moist. No oropharyngeal exudate.  Eyes: Conjunctivae are normal. Right eye exhibits no discharge. Left eye exhibits no discharge. No scleral icterus.  Neck: Normal range of motion. Neck supple. No JVD present. No tracheal deviation present. No thyromegaly present.  Cardiovascular: Normal rate, regular rhythm, normal heart sounds and intact distal pulses.  Exam reveals no gallop and no friction rub.   No murmur heard. Pulses:      Carotid pulses are 1+ on the right side, and 1+ on the left side.      Radial pulses are 1+ on the right side, and 1+ on the left side.       Femoral pulses are 1+ on the right side, and 1+ on the left side.      Popliteal pulses are 1+ on the right side, and 1+ on the left side.       Dorsalis pedis pulses are 1+ on the right side, and 1+ on the left side.       Posterior tibial pulses are 1+ on the right side, and 1+ on the  left side.  Pulmonary/Chest: Effort normal and breath sounds normal. No stridor. No respiratory distress. He has no wheezes. He has no rales. He exhibits no tenderness.  Abdominal: Soft. Bowel sounds are normal. He exhibits no distension and no mass. There is no tenderness. There is no rebound and no guarding. Hernia confirmed negative in the right inguinal area and confirmed negative in the left inguinal area.  Genitourinary: Rectum normal, testes normal and penis normal. Rectal exam shows no external hemorrhoid, no internal hemorrhoid, no fissure, no mass, no tenderness and anal tone normal. Guaiac negative stool. Prostate is enlarged (1+ smooth bilateral BPH). Prostate is not tender. Right testis shows no mass, no swelling and no tenderness. Right testis is descended. Left testis shows no mass, no swelling and no tenderness. Left testis is descended. Circumcised. No penile tenderness. No discharge found.  Musculoskeletal: Normal range of motion. He exhibits no edema and no tenderness.       Right foot: Normal. He exhibits normal range of motion, no tenderness, no bony tenderness, no swelling, normal capillary refill, no crepitus, no deformity and no laceration.  Lymphadenopathy:    He has no cervical adenopathy.       Right: No inguinal adenopathy present.       Left: No inguinal adenopathy present.  Neurological: He is oriented to person, place, and time. No  cranial nerve deficit.  Skin: Skin is warm and dry. No rash noted. He is not diaphoretic. No erythema. No pallor.  Psychiatric: He has a normal mood and affect. His behavior is normal. Judgment and thought content normal.      Lab Results  Component Value Date   WBC 9.8 10/06/2009   HGB 15.6 10/06/2009   HCT 46.3 10/06/2009   PLT 188.0 10/06/2009   CHOL 182 10/06/2009   TRIG 130.0 10/06/2009   HDL 45.40 10/06/2009   LDLCALC 111* 10/06/2009   PSA 0.53 10/06/2009      Assessment & Plan:

## 2011-06-15 NOTE — Patient Instructions (Signed)
Health Maintenance, Males A healthy lifestyle and preventative care can promote health and wellness.  Maintain regular health, dental, and eye exams.   Eat a healthy diet. Foods like vegetables, fruits, whole grains, low-fat dairy products, and lean protein foods contain the nutrients you need without too many calories. Decrease your intake of foods high in solid fats, added sugars, and salt. Get information about a proper diet from your caregiver, if necessary.   Regular physical exercise is one of the most important things you can do for your health. Most adults should get at least 150 minutes of moderate-intensity exercise (any activity that increases your heart rate and causes you to sweat) each week. In addition, most adults need muscle-strengthening exercises on 2 or more days a week.    Maintain a healthy weight. The body mass index (BMI) is a screening tool to identify possible weight problems. It provides an estimate of body fat based on height and weight. Your caregiver can help determine your BMI, and can help you achieve or maintain a healthy weight. For adults 20 years and older:   A BMI below 18.5 is considered underweight.   A BMI of 18.5 to 24.9 is normal.   A BMI of 25 to 29.9 is considered overweight.   A BMI of 30 and above is considered obese.   Maintain normal blood lipids and cholesterol by exercising and minimizing your intake of saturated fat. Eat a balanced diet with plenty of fruits and vegetables. Blood tests for lipids and cholesterol should begin at age 20 and be repeated every 5 years. If your lipid or cholesterol levels are high, you are over 50, or you are a high risk for heart disease, you may need your cholesterol levels checked more frequently.Ongoing high lipid and cholesterol levels should be treated with medicines, if diet and exercise are not effective.   If you smoke, find out from your caregiver how to quit. If you do not use tobacco, do not start.    If you choose to drink alcohol, do not exceed 2 drinks per day. One drink is considered to be 12 ounces (355 mL) of beer, 5 ounces (148 mL) of wine, or 1.5 ounces (44 mL) of liquor.   Avoid use of street drugs. Do not share needles with anyone. Ask for help if you need support or instructions about stopping the use of drugs.   High blood pressure causes heart disease and increases the risk of stroke. Blood pressure should be checked at least every 1 to 2 years. Ongoing high blood pressure should be treated with medicines if weight loss and exercise are not effective.   If you are 45 to 41 years old, ask your caregiver if you should take aspirin to prevent heart disease.   Diabetes screening involves taking a blood sample to check your fasting blood sugar level. This should be done once every 3 years, after age 45, if you are within normal weight and without risk factors for diabetes. Testing should be considered at a younger age or be carried out more frequently if you are overweight and have at least 1 risk factor for diabetes.   Colorectal cancer can be detected and often prevented. Most routine colorectal cancer screening begins at the age of 50 and continues through age 75. However, your caregiver may recommend screening at an earlier age if you have risk factors for colon cancer. On a yearly basis, your caregiver may provide home test kits to check for hidden   blood in the stool. Use of a small camera at the end of a tube, to directly examine the colon (sigmoidoscopy or colonoscopy), can detect the earliest forms of colorectal cancer. Talk to your caregiver about this at age 50, when routine screening begins. Direct examination of the colon should be repeated every 5 to 10 years through age 75, unless early forms of pre-cancerous polyps or small growths are found.   Healthy men should no longer receive prostate-specific antigen (PSA) blood tests as part of routine cancer screening. Consult with  your caregiver about prostate cancer screening.   Practice safe sex. Use condoms and avoid high-risk sexual practices to reduce the spread of sexually transmitted infections (STIs).   Use sunscreen with a sun protection factor (SPF) of 30 or greater. Apply sunscreen liberally and repeatedly throughout the day. You should seek shade when your shadow is shorter than you. Protect yourself by wearing long sleeves, pants, a wide-brimmed hat, and sunglasses year round, whenever you are outdoors.   Notify your caregiver of new moles or changes in moles, especially if there is a change in shape or color. Also notify your caregiver if a mole is larger than the size of a pencil eraser.   A one-time screening for abdominal aortic aneurysm (AAA) and surgical repair of large AAAs by sound wave imaging (ultrasonography) is recommended for ages 65 to 75 years who are current or former smokers.   Stay current with your immunizations.  Document Released: 10/27/2007 Document Revised: 01/10/2011 Document Reviewed: 09/25/2010 [employer] Patient Information 2012 [employer]. 

## 2011-06-15 NOTE — Assessment & Plan Note (Signed)
He has no symptoms, I will check his PSA today

## 2011-06-15 NOTE — Assessment & Plan Note (Signed)
Xray of right foot to look for DJD

## 2011-06-17 ENCOUNTER — Encounter: Payer: Self-pay | Admitting: Internal Medicine

## 2011-07-06 ENCOUNTER — Encounter: Payer: Self-pay | Admitting: Internal Medicine

## 2011-07-06 ENCOUNTER — Ambulatory Visit (INDEPENDENT_AMBULATORY_CARE_PROVIDER_SITE_OTHER): Payer: BC Managed Care – PPO | Admitting: Internal Medicine

## 2011-07-06 VITALS — BP 128/88 | HR 68 | Temp 98.1°F | Resp 16 | Wt 203.0 lb

## 2011-07-06 DIAGNOSIS — R9431 Abnormal electrocardiogram [ECG] [EKG]: Secondary | ICD-10-CM | POA: Insufficient documentation

## 2011-07-06 DIAGNOSIS — K219 Gastro-esophageal reflux disease without esophagitis: Secondary | ICD-10-CM

## 2011-07-06 DIAGNOSIS — R0789 Other chest pain: Secondary | ICD-10-CM | POA: Insufficient documentation

## 2011-07-06 NOTE — Progress Notes (Signed)
Subjective:    Patient ID: Jacob Cross, male    DOB: [birth year], 41 y.o.   MRN: [id number]  Chest Pain  This is a chronic problem. The current episode started more than 1 year ago. The onset quality is gradual. The problem occurs intermittently. The problem has been unchanged. The pain is present in the lateral region. The pain is at a severity of 1/10. The pain is mild. The quality of the pain is described as dull. The pain does not radiate. Pertinent negatives include no abdominal pain, back pain, claudication, cough, diaphoresis, dizziness, exertional chest pressure, fever, headaches, hemoptysis, irregular heartbeat, leg pain, lower extremity edema, malaise/fatigue, nausea, near-syncope, numbness, orthopnea, palpitations, PND, shortness of breath, sputum production, syncope, vomiting or weakness. The pain is aggravated by walking. He has tried nothing for the symptoms.  Pertinent negatives for past medical history include no muscle weakness and no seizures.  Pertinent negatives for family medical history include: family history of aortic dissection, no CAD in family and no heart disease in family.  Gastrophageal Reflux He complains of chest pain and heartburn. He reports no abdominal pain, no belching, no choking, no coughing, no dysphagia, no early satiety, no globus sensation, no hoarse voice, no nausea, no sore throat, no stridor, no tooth decay, no water brash or no wheezing. This is a chronic problem. The current episode started more than 1 year ago. The problem occurs occasionally. The problem has been unchanged. The symptoms are aggravated by nothing. Pertinent negatives include no anemia, fatigue, melena, muscle weakness, orthopnea or weight loss. He has tried a PPI for the symptoms. The treatment provided significant relief.      Review of Systems  Constitutional: Negative for fever, chills, weight loss, malaise/fatigue, diaphoresis, activity change, appetite change, fatigue and  unexpected weight change.  HENT: Negative.  Negative for sore throat and hoarse voice.   Eyes: Negative.   Respiratory: Negative for apnea, cough, hemoptysis, sputum production, choking, chest tightness, shortness of breath, wheezing and stridor.   Cardiovascular: Positive for chest pain. Negative for palpitations, orthopnea, claudication, leg swelling, syncope, PND and near-syncope.  Gastrointestinal: Positive for heartburn. Negative for dysphagia, nausea, vomiting, abdominal pain, diarrhea, constipation, blood in stool, melena, abdominal distention and anal bleeding.  Genitourinary: Negative.   Musculoskeletal: Negative for myalgias, back pain, joint swelling, arthralgias, gait problem and muscle weakness.  Skin: Negative for color change, pallor, rash and wound.  Neurological: Negative for dizziness, tremors, seizures, syncope, facial asymmetry, speech difficulty, weakness, light-headedness, numbness and headaches.  Hematological: Negative for adenopathy. Does not bruise/bleed easily.  Psychiatric/Behavioral: Negative.        Objective:   Physical Exam  Vitals reviewed. Constitutional: He is oriented to person, place, and time. He appears well-developed and well-nourished. No distress.  HENT:  Head: Normocephalic and atraumatic.  Mouth/Throat: Oropharynx is clear and moist. No oropharyngeal exudate.  Eyes: Conjunctivae are normal. Right eye exhibits no discharge. Left eye exhibits no discharge. No scleral icterus.  Neck: Normal range of motion. Neck supple. No JVD present. No tracheal deviation present. No thyromegaly present.  Cardiovascular: Normal rate, regular rhythm, normal heart sounds and intact distal pulses.  Exam reveals no gallop and no friction rub.   No murmur heard. Pulmonary/Chest: Effort normal and breath sounds normal. No stridor. No respiratory distress. He has no wheezes. He has no rales. He exhibits no tenderness.  Abdominal: Soft. Bowel sounds are normal. He  exhibits no distension and no mass. There is no tenderness. There is no rebound and  no guarding.  Musculoskeletal: Normal range of motion. He exhibits no edema and no tenderness.  Lymphadenopathy:    He has no cervical adenopathy.  Neurological: He is oriented to person, place, and time.  Skin: Skin is warm and dry. No rash noted. He is not diaphoretic. No erythema. No pallor.  Psychiatric: He has a normal mood and affect. His behavior is normal. Judgment and thought content normal.      Lab Results  Component Value Date   WBC 10.6* 06/15/2011   HGB 15.1 06/15/2011   HCT 45.3 06/15/2011   PLT 218.0 06/15/2011   GLUCOSE 85 06/15/2011   CHOL 193 06/15/2011   TRIG 109.0 06/15/2011   HDL 39.30 06/15/2011   LDLCALC 132* 06/15/2011   ALT 21 06/15/2011   AST 19 06/15/2011   NA 140 06/15/2011   K 4.1 06/15/2011   CL 104 06/15/2011   CREATININE 1.0 06/15/2011   BUN 16 06/15/2011   CO2 30 06/15/2011   TSH 1.22 06/15/2011   PSA 0.51 06/15/2011      Assessment & Plan:

## 2011-07-06 NOTE — Assessment & Plan Note (Signed)
EKG today shows loss of voltage in V1 and V2 with early repol, this is unchanged from the prior EKG of 10/12.

## 2011-07-06 NOTE — Assessment & Plan Note (Signed)
Continue BID nexium

## 2011-07-06 NOTE — Patient Instructions (Signed)
Chest Pain (Nonspecific) It is often hard to give a specific diagnosis for the cause of chest pain. There is always a chance that your pain could be related to something serious, such as a heart attack or a blood clot in the lungs. You need to follow up with your caregiver for further evaluation. CAUSES   Heartburn.   Pneumonia or bronchitis.   Anxiety and stress.   Inflammation around your heart (pericarditis) or lung (pleuritis or pleurisy).   A blood clot in the lung.   A collapsed lung (pneumothorax). It can develop suddenly on its own (spontaneous pneumothorax) or from injury (trauma) to the chest.  The chest wall is composed of bones, muscles, and cartilage. Any of these can be the source of the pain.  The bones can be bruised by injury.   The muscles or cartilage can be strained by coughing or overwork.   The cartilage can be affected by inflammation and become sore (costochondritis).  DIAGNOSIS  Lab tests or other studies, such as X-rays, an EKG, stress testing, or cardiac imaging, may be needed to find the cause of your pain.  TREATMENT   Treatment depends on what may be causing your chest pain. Treatment may include:   Acid blockers for heartburn.   Anti-inflammatory medicine.   Pain medicine for inflammatory conditions.   Antibiotics if an infection is present.   You may be advised to change lifestyle habits. This includes stopping smoking and avoiding caffeine and chocolate.   You may be advised to keep your head raised (elevated) when sleeping. This reduces the chance of acid going backward from your stomach into your esophagus.   Most of the time, nonspecific chest pain will improve within 2 to 3 days with rest and mild pain medicine.  HOME CARE INSTRUCTIONS   If antibiotics were prescribed, take the full amount even if you start to feel better.   For the next few days, avoid physical activities that bring on chest pain. Continue physical activities as  directed.   Do not smoke cigarettes or drink alcohol until your symptoms are gone.   Only take over-the-counter or prescription medicine for pain, discomfort, or fever as directed by your caregiver.   Follow your caregiver's suggestions for further testing if your chest pain does not go away.   Keep any follow-up appointments you made. If you do not go to an appointment, you could develop lasting (chronic) problems with pain. If there is any problem keeping an appointment, you must call to reschedule.  SEEK MEDICAL CARE IF:   You think you are having problems from the medicine you are taking. Read your medicine instructions carefully.   Your chest pain does not go away, even after treatment.   You develop a rash with blisters on your chest.  SEEK IMMEDIATE MEDICAL CARE IF:   You have increased chest pain or pain that spreads to your arm, neck, jaw, back, or belly (abdomen).   You develop shortness of breath, an increasing cough, or you are coughing up blood.   You have severe back or abdominal pain, feel sick to your stomach (nauseous) or throw up (vomit).   You develop severe weakness, fainting, or chills.   You have an oral temperature above 102 F (38.9 C), not controlled by medicine.  THIS IS AN EMERGENCY. Do not wait to see if the pain will go away. Get medical help at once. Call your local emergency services (911 in [place] Do not drive yourself to   the hospital. MAKE SURE YOU:   Understand these instructions.   Will watch your condition.   Will get help right away if you are not doing well or get worse.  Document Released: 02/07/2005 Document Revised: 01/10/2011 Document Reviewed: 12/04/2007 [employer] Patient Information 2012 [employer]. 

## 2011-07-06 NOTE — Assessment & Plan Note (Signed)
He has chronic, recurrent atypical chest pain and an abnormal EKG that is unchanged today from his prior EKG's. His pain could be from GERD but I think CAD to be screened for as well so I have referred him to cardiology for further evaluation.

## 2011-07-19 ENCOUNTER — Encounter: Payer: Self-pay | Admitting: *Deleted

## 2011-07-23 ENCOUNTER — Ambulatory Visit (INDEPENDENT_AMBULATORY_CARE_PROVIDER_SITE_OTHER): Payer: BC Managed Care – PPO | Admitting: Cardiology

## 2011-07-23 ENCOUNTER — Encounter: Payer: Self-pay | Admitting: Cardiology

## 2011-07-23 DIAGNOSIS — R9431 Abnormal electrocardiogram [ECG] [EKG]: Secondary | ICD-10-CM

## 2011-07-23 DIAGNOSIS — R0789 Other chest pain: Secondary | ICD-10-CM

## 2011-07-23 DIAGNOSIS — R079 Chest pain, unspecified: Secondary | ICD-10-CM

## 2011-07-23 NOTE — Patient Instructions (Signed)
Your physician has requested that you have an exercise tolerance test. For further information please visit [email or web address] Please also follow instruction sheet, as given.   Exercise Stress Electrocardiography An exercise stress test is a heart test (EKG) which is done while you are moving. You will walk on a treadmill. This test will tell your doctor how your heart does when it is forced to work harder and how much activity you can safely handle. BEFORE THE TEST  Wear shorts or athletic pants.   Wear comfortable tennis shoes.   Women need to wear a bra that allows patches to be put on under it.  TEST  An EKG cable will be attached to your waist. This cable is hooked up to patches, which look like round stickers stuck to your chest.   You will be asked to walk on the treadmill.   You will walk until you are too tired or until you are told to stop.   Tell the doctor right away if you have:   Chest pain.   Leg cramps.   Shortness of breath.   Dizziness.   The test may last 30 minutes to 1 hour. The timing depends on your physical condition and the condition of your heart.  AFTER THE TEST  You will rest for about 6 minutes. During this time, your heart rhythm and blood pressure will be checked.   The testing equipment will be removed from your body and you can get dressed.   You may go home or back to your hospital room. You may keep doing all your usual activities as told by your doctor.  Finding out the results of your test Ask when your test results will be ready. Make sure you get your test results. Document Released: 10/17/2007 Document Revised: 04/19/2011 Document Reviewed: 10/17/2007 [facility] Patient Information 2012 [place], [place].

## 2011-07-23 NOTE — Progress Notes (Signed)
  HPI: 41 year old male for evaluation of chest pain. Patient describes intermittent chest pain for approximately 2 years. The pain is substernal or occasionally under the left breast. No radiation or associated symptoms. Pain is not exertional, pleuritic, positional or related to food. They can last several minutes to all day. No alleviating factors. He apparently was admitted to [facility] 2 years ago with similar symptoms. He ruled out by his report and had a negative functional study. His symptoms have persisted and we were asked to evaluate.  Current Outpatient Prescriptions  Medication Sig Dispense Refill  . esomeprazole (NEXIUM) 40 MG capsule Take 1 capsule (40 mg total) by mouth 2 (two) times daily.  120 capsule  2    No Known Allergies  Past Medical History  Diagnosis Date  . Allergy   . GERD (gastroesophageal reflux disease)   . HYPERTROPHY PROSTATE W/UR OBST & OTH LUTS   . Chest pain, atypical     Past Surgical History  Procedure Date  . Hernia repair   . Tonsillectomy     History   Social History  . Marital Status: Single    Spouse Name: N/A    Number of Children: 2  . Years of Education: N/A   Occupational History  . [employer]    Social History Main Topics  . Smoking status: Former Games developer  . Smokeless tobacco: Not on file  . Alcohol Use: Yes     Occasional  . Drug Use: No  . Sexually Active: Yes    Birth Control/ Protection: Pill   Other Topics Concern  . Not on file   Social History Narrative   Regular Exercise -  YES    Family History  Problem Relation Age of Onset  . Colon cancer Other     1st degree relative <60  . Diabetes Other     1st degree relative  . Hypertension Other   . Stroke Other     Male 1st degree relative <50    ROS: no fevers or chills, productive cough, hemoptysis, dysphasia, odynophagia, melena, hematochezia, dysuria, hematuria, rash, seizure activity, orthopnea, PND, pedal edema, claudication. Remaining  systems are negative.  Physical Exam:   Blood pressure 149/86, pulse 72, height 5' (1.524 m), weight 196 lb (88.905 kg).  General:  Well developed/well nourished in NAD Skin warm/dry Patient not depressed No peripheral clubbing Back-normal HEENT-normal/normal eyelids Neck supple/normal carotid upstroke bilaterally; no bruits; no JVD; no thyromegaly chest - CTA/ normal expansion CV - RRR/normal S1 and S2; no murmurs, rubs or gallops;  PMI nondisplaced Abdomen -NT/ND, no HSM, no mass, + bowel sounds, no bruit 2+ femoral pulses, no bruits Ext-no edema, chords, 2+ DP Neuro-grossly nonfocal  ECG sinus rhythm, RV conduction delay, early repolarization.

## 2011-07-23 NOTE — Assessment & Plan Note (Signed)
Exercise treadmill as outlined.

## 2011-07-23 NOTE — Assessment & Plan Note (Signed)
Symptoms are atypical and no significant risk factors other than remote tobacco use. Previous negative functional study per patient but records not available. Will arrange exercise treadmill. If negative no further evaluation planned. He will followup with primary care. May need GI evaluation in the future.

## 2011-08-02 ENCOUNTER — Encounter: Payer: BC Managed Care – PPO | Admitting: Physician Assistant

## 2011-08-22 ENCOUNTER — Ambulatory Visit (INDEPENDENT_AMBULATORY_CARE_PROVIDER_SITE_OTHER): Payer: BC Managed Care – PPO | Admitting: Physician Assistant

## 2011-08-22 DIAGNOSIS — R079 Chest pain, unspecified: Secondary | ICD-10-CM

## 2011-08-22 NOTE — Progress Notes (Signed)
Exercise Treadmill Test  Pre-Exercise Testing Evaluation Rhythm: normal sinus  Rate: 89   PR:  .14 QRS:  .07  QT:  34 QTc: 41     Test  Exercise Tolerance Test Ordering MD: Olga Millers, MD  Interpreting MD:  Tereso Newcomer PA-C  Unique Test No: 1  Treadmill:  1  Indication for ETT: chest pain - rule out ischemia  Contraindication to ETT: No   Stress Modality: exercise - treadmill  Cardiac Imaging Performed: non   Protocol: standard Bruce - maximal  Max BP: 157/84  Max MPHR (bpm):  180 85% MPR (bpm):  153  MPHR obtained (bpm):  173 % MPHR obtained:  92%  Reached 85% MPHR (min:sec):  7:01 Total Exercise Time (min-sec):  10:04  Workload in METS:  11.8 Borg Scale: 15  Reason ETT Terminated:  patient's desire to stop    ST Segment Analysis At Rest: normal ST segments - no evidence of significant ST depression With Exercise: no evidence of significant ST depression  Other Information Arrhythmia:  No Angina during ETT:  present (1) Quality of ETT:  diagnostic  ETT Interpretation:  normal - no evidence of ischemia by ST analysis  Comments: Good exercise tolerance. Patient did complain of chest pain. Normal BP response to exercise (increased artifact at peak exercise and BP not obtained). No ST-T changes to suggest ischemia.   Recommendations: Follow up with Dr. Olga Millers as directed. Tereso Newcomer, PA-C  12:20 PM 08/22/2011

## 2012-02-20 ENCOUNTER — Other Ambulatory Visit: Payer: Self-pay | Admitting: Internal Medicine

## 2012-09-10 ENCOUNTER — Emergency Department: Payer: Self-pay | Admitting: Emergency Medicine

## 2012-09-10 LAB — URINALYSIS, COMPLETE
Bilirubin,UR: NEGATIVE
Nitrite: NEGATIVE
Ph: 7 (ref 4.5–8.0)
Protein: NEGATIVE
RBC,UR: <1 /HPF (ref 0–5)
WBC UR: 1 /HPF (ref 0–5)

## 2012-11-17 ENCOUNTER — Other Ambulatory Visit: Payer: Self-pay | Admitting: Internal Medicine

## 2013-01-09 ENCOUNTER — Other Ambulatory Visit (INDEPENDENT_AMBULATORY_CARE_PROVIDER_SITE_OTHER): Payer: BC Managed Care – PPO

## 2013-01-09 ENCOUNTER — Ambulatory Visit (INDEPENDENT_AMBULATORY_CARE_PROVIDER_SITE_OTHER): Payer: BC Managed Care – PPO | Admitting: Internal Medicine

## 2013-01-09 VITALS — BP 120/82 | HR 64 | Temp 97.8°F | Resp 16 | Ht 66.0 in | Wt 193.0 lb

## 2013-01-09 DIAGNOSIS — Z0000 Encounter for general adult medical examination without abnormal findings: Secondary | ICD-10-CM

## 2013-01-09 DIAGNOSIS — R9431 Abnormal electrocardiogram [ECG] [EKG]: Secondary | ICD-10-CM

## 2013-01-09 LAB — TSH: TSH: 0.81 u[IU]/mL (ref 0.35–5.50)

## 2013-01-09 LAB — CBC WITH DIFFERENTIAL/PLATELET
Basophils Relative: 0.6 % (ref 0.0–3.0)
Eosinophils Absolute: 0.2 10*3/uL (ref 0.0–0.7)
Eosinophils Relative: 2 % (ref 0.0–5.0)
Lymphocytes Relative: 26.3 % (ref 12.0–46.0)
MCHC: 33.3 g/dL (ref 30.0–36.0)
Neutrophils Relative %: 63.6 % (ref 43.0–77.0)
Platelets: 205 10*3/uL (ref 150.0–400.0)
RBC: 5.25 Mil/uL (ref 4.22–5.81)
WBC: 11.1 10*3/uL — ABNORMAL HIGH (ref 4.5–10.5)

## 2013-01-09 LAB — URINALYSIS, ROUTINE W REFLEX MICROSCOPIC
Bilirubin Urine: NEGATIVE
Hgb urine dipstick: NEGATIVE
Ketones, ur: NEGATIVE
Leukocytes, UA: NEGATIVE
Urobilinogen, UA: 1 (ref 0.0–1.0)

## 2013-01-09 LAB — COMPREHENSIVE METABOLIC PANEL
Albumin: 4.3 g/dL (ref 3.5–5.2)
BUN: 14 mg/dL (ref 6–23)
Calcium: 8.7 mg/dL (ref 8.4–10.5)
Chloride: 105 mEq/L (ref 96–112)
Glucose, Bld: 87 mg/dL (ref 70–99)
Potassium: 3.6 mEq/L (ref 3.5–5.1)

## 2013-01-09 LAB — LIPID PANEL
HDL: 48.5 mg/dL (ref >39.00)
Total CHOL/HDL Ratio: 4
VLDL: 14.4 mg/dL (ref 0.0–40.0)

## 2013-01-09 NOTE — Progress Notes (Signed)
Subjective:    Patient ID: Jacob Cross, male    DOB: 07-Jun-1970, 42 y.o.   MRN: [id number]  HPI  He returns for a physical and he tells me that he feels well and offers no complaints.  Review of Systems  Constitutional: Negative.  Negative for fever, chills, diaphoresis, activity change, appetite change, fatigue and unexpected weight change.  HENT: Negative.   Eyes: Negative.   Respiratory: Negative.  Negative for apnea, cough, choking, chest tightness, shortness of breath, wheezing and stridor.   Cardiovascular: Negative.  Negative for chest pain, palpitations and leg swelling.  Gastrointestinal: Negative.  Negative for nausea, vomiting, abdominal pain, diarrhea and constipation.  Endocrine: Negative.   Genitourinary: Negative.   Musculoskeletal: Negative.   Skin: Negative.   Allergic/Immunologic: Negative.   Neurological: Negative.  Negative for dizziness, tremors, weakness and light-headedness.  Hematological: Negative.  Negative for adenopathy. Does not bruise/bleed easily.  Psychiatric/Behavioral: Negative.        Objective:   Physical Exam  Vitals reviewed. Constitutional: He is oriented to person, place, and time. He appears well-developed and well-nourished. No distress.  HENT:  Head: Normocephalic and atraumatic.  Mouth/Throat: Oropharynx is clear and moist. No oropharyngeal exudate.  Eyes: Conjunctivae are normal. Right eye exhibits no discharge. Left eye exhibits no discharge. No scleral icterus.  Neck: Normal range of motion. Neck supple. No JVD present. No tracheal deviation present. No thyromegaly present.  Cardiovascular: Normal rate, regular rhythm, normal heart sounds and intact distal pulses.  Exam reveals no gallop and no friction rub.   No murmur heard. Pulmonary/Chest: Effort normal and breath sounds normal. No stridor. No respiratory distress. He has no wheezes. He has no rales.  Abdominal: Soft. Bowel sounds are normal. He exhibits no distension and no  mass. There is no tenderness. There is no rebound and no guarding. Hernia confirmed negative in the right inguinal area and confirmed negative in the left inguinal area.  Genitourinary: Rectum normal, prostate normal, testes normal and penis normal. Rectal exam shows no external hemorrhoid, no internal hemorrhoid, no fissure, no mass, no tenderness and anal tone normal. Guaiac negative stool. Prostate is not enlarged and not tender. Right testis shows no mass, no swelling and no tenderness. Right testis is descended. Left testis shows no mass, no swelling and no tenderness. Left testis is descended. Circumcised. No penile erythema or penile tenderness. No discharge found.  Musculoskeletal: Normal range of motion. He exhibits no edema and no tenderness.  Lymphadenopathy:    He has no cervical adenopathy.       Right: No inguinal adenopathy present.       Left: No inguinal adenopathy present.  Neurological: He is oriented to person, place, and time.  Skin: Skin is warm and dry. No rash noted. He is not diaphoretic. No erythema. No pallor.  Psychiatric: He has a normal mood and affect. His behavior is normal. Judgment and thought content normal.     Lab Results  Component Value Date   WBC 10.6* 06/15/2011   HGB 15.1 06/15/2011   HCT 45.3 06/15/2011   PLT 218.0 06/15/2011   GLUCOSE 85 06/15/2011   CHOL 193 06/15/2011   TRIG 109.0 06/15/2011   HDL 39.30 06/15/2011   LDLCALC 132* 06/15/2011   ALT 21 06/15/2011   AST 19 06/15/2011   NA 140 06/15/2011   K 4.1 06/15/2011   CL 104 06/15/2011   CREATININE 1.0 06/15/2011   BUN 16 06/15/2011   CO2 30 06/15/2011   TSH 1.22 06/15/2011  PSA 0.51 06/15/2011       Assessment & Plan:

## 2013-01-09 NOTE — Assessment & Plan Note (Signed)
His EKG is unchanged from prior EKG's and he has no alarming s/s No further evaluation needed st this time

## 2013-01-09 NOTE — Patient Instructions (Signed)
Health Maintenance, Males A healthy lifestyle and preventative care can promote health and wellness.  Maintain regular health, dental, and eye exams.  Eat a healthy diet. Foods like vegetables, fruits, whole grains, low-fat dairy products, and lean protein foods contain the nutrients you need without too many calories. Decrease your intake of foods high in solid fats, added sugars, and salt. Get information about a proper diet from your caregiver, if necessary.  Regular physical exercise is one of the most important things you can do for your health. Most adults should get at least 150 minutes of moderate-intensity exercise (any activity that increases your heart rate and causes you to sweat) each week. In addition, most adults need muscle-strengthening exercises on 2 or more days a week.   Maintain a healthy weight. The body mass index (BMI) is a screening tool to identify possible weight problems. It provides an estimate of body fat based on height and weight. Your caregiver can help determine your BMI, and can help you achieve or maintain a healthy weight. For adults 20 years and older:  A BMI below 18.5 is considered underweight.  A BMI of 18.5 to 24.9 is normal.  A BMI of 25 to 29.9 is considered overweight.  A BMI of 30 and above is considered obese.  Maintain normal blood lipids and cholesterol by exercising and minimizing your intake of saturated fat. Eat a balanced diet with plenty of fruits and vegetables. Blood tests for lipids and cholesterol should begin at age 20 and be repeated every 5 years. If your lipid or cholesterol levels are high, you are over 50, or you are a high risk for heart disease, you may need your cholesterol levels checked more frequently.Ongoing high lipid and cholesterol levels should be treated with medicines, if diet and exercise are not effective.  If you smoke, find out from your caregiver how to quit. If you do not use tobacco, do not start.  If you  choose to drink alcohol, do not exceed 2 drinks per day. One drink is considered to be 12 ounces (355 mL) of beer, 5 ounces (148 mL) of wine, or 1.5 ounces (44 mL) of liquor.  Avoid use of street drugs. Do not share needles with anyone. Ask for help if you need support or instructions about stopping the use of drugs.  High blood pressure causes heart disease and increases the risk of stroke. Blood pressure should be checked at least every 1 to 2 years. Ongoing high blood pressure should be treated with medicines if weight loss and exercise are not effective.  If you are 45 to 42 years old, ask your caregiver if you should take aspirin to prevent heart disease.  Diabetes screening involves taking a blood sample to check your fasting blood sugar level. This should be done once every 3 years, after age 45, if you are within normal weight and without risk factors for diabetes. Testing should be considered at a younger age or be carried out more frequently if you are overweight and have at least 1 risk factor for diabetes.  Colorectal cancer can be detected and often prevented. Most routine colorectal cancer screening begins at the age of 50 and continues through age 75. However, your caregiver may recommend screening at an earlier age if you have risk factors for colon cancer. On a yearly basis, your caregiver may provide home test kits to check for hidden blood in the stool. Use of a small camera at the end of a tube,   to directly examine the colon (sigmoidoscopy or colonoscopy), can detect the earliest forms of colorectal cancer. Talk to your caregiver about this at age 50, when routine screening begins. Direct examination of the colon should be repeated every 5 to 10 years through age 75, unless early forms of pre-cancerous polyps or small growths are found.  Hepatitis C blood testing is recommended for all people born from 1945 through 1965 and any individual with known risks for hepatitis C.  Healthy  men should no longer receive prostate-specific antigen (PSA) blood tests as part of routine cancer screening. Consult with your caregiver about prostate cancer screening.  Testicular cancer screening is not recommended for adolescents or adult males who have no symptoms. Screening includes self-exam, caregiver exam, and other screening tests. Consult with your caregiver about any symptoms you have or any concerns you have about testicular cancer.  Practice safe sex. Use condoms and avoid high-risk sexual practices to reduce the spread of sexually transmitted infections (STIs).  Use sunscreen with a sun protection factor (SPF) of 30 or greater. Apply sunscreen liberally and repeatedly throughout the day. You should seek shade when your shadow is shorter than you. Protect yourself by wearing long sleeves, pants, a wide-brimmed hat, and sunglasses year round, whenever you are outdoors.  Notify your caregiver of new moles or changes in moles, especially if there is a change in shape or color. Also notify your caregiver if a mole is larger than the size of a pencil eraser.  A one-time screening for abdominal aortic aneurysm (AAA) and surgical repair of large AAAs by sound wave imaging (ultrasonography) is recommended for ages 65 to 75 years who are current or former smokers.  Stay current with your immunizations. Document Released: 10/27/2007 Document Revised: 07/23/2011 Document Reviewed: 09/25/2010 [employer] Patient Information 2014 [employer].  

## 2013-01-11 ENCOUNTER — Encounter: Payer: Self-pay | Admitting: Internal Medicine

## 2013-01-12 ENCOUNTER — Encounter: Payer: Self-pay | Admitting: Internal Medicine

## 2013-01-12 NOTE — Assessment & Plan Note (Signed)
Exam done Vaccines were reviewed Labs ordered Pt ed material was given 

## 2013-02-26 ENCOUNTER — Ambulatory Visit (INDEPENDENT_AMBULATORY_CARE_PROVIDER_SITE_OTHER): Payer: BC Managed Care – PPO | Admitting: Internal Medicine

## 2013-02-26 ENCOUNTER — Encounter: Payer: Self-pay | Admitting: Internal Medicine

## 2013-02-26 VITALS — BP 118/76 | HR 60 | Temp 98.0°F | Resp 16

## 2013-02-26 DIAGNOSIS — G4730 Sleep apnea, unspecified: Secondary | ICD-10-CM

## 2013-02-26 DIAGNOSIS — G4733 Obstructive sleep apnea (adult) (pediatric): Secondary | ICD-10-CM | POA: Insufficient documentation

## 2013-02-26 NOTE — Patient Instructions (Signed)

## 2013-02-26 NOTE — Progress Notes (Signed)
Subjective:    Patient ID: Jacob Cross, male    DOB: 23-Jul-1970, 42 y.o.   MRN: [id number]  Headache  This is a new problem. The current episode started 1 to 4 weeks ago. The problem occurs intermittently. The problem has been unchanged. The pain is located in the bilateral region. The pain does not radiate. The pain quality is not similar to prior headaches. The quality of the pain is described as aching. The pain is at a severity of 1/10. The pain is mild. Associated symptoms include insomnia (frequent awakenings). Pertinent negatives include no abdominal pain, abnormal behavior, anorexia, back pain, blurred vision, coughing, dizziness, drainage, ear pain, eye pain, eye redness, eye watering, facial sweating, fever, hearing loss, loss of balance, muscle aches, nausea, neck pain, numbness, phonophobia, photophobia, rhinorrhea, scalp tenderness, seizures, sinus pressure, sore throat, swollen glands, tingling, tinnitus, visual change, vomiting, weakness or weight loss. Nothing aggravates the symptoms. His past medical history is significant for obesity. There is no history of cancer, cluster headaches, hypertension, immunosuppression, migraine headaches, migraines in the family, pseudotumor cerebri, recent head traumas, sinus disease or TMJ.      Review of Systems  Constitutional: Negative.  Negative for fever, chills, weight loss, diaphoresis, appetite change and fatigue.  HENT: Negative for ear pain, hearing loss, rhinorrhea, sinus pressure, sore throat and tinnitus.   Eyes: Negative.  Negative for blurred vision, photophobia, pain and redness.  Respiratory: Positive for apnea (and heavy snoring). Negative for cough, choking, chest tightness, shortness of breath, wheezing and stridor.   Cardiovascular: Negative.  Negative for chest pain, palpitations and leg swelling.  Gastrointestinal: Negative.  Negative for nausea, vomiting, abdominal pain, diarrhea, constipation, blood in stool and anorexia.   Endocrine: Negative.   Genitourinary: Negative.   Musculoskeletal: Negative.  Negative for arthralgias, back pain, gait problem, joint swelling, myalgias, neck pain and neck stiffness.  Skin: Negative.   Allergic/Immunologic: Negative.   Neurological: Positive for headaches. Negative for dizziness, tingling, tremors, seizures, syncope, speech difficulty, weakness, light-headedness, numbness and loss of balance.  Hematological: Negative.  Negative for adenopathy. Does not bruise/bleed easily.  Psychiatric/Behavioral: The patient has insomnia (frequent awakenings).        Objective:   Physical Exam  Vitals reviewed. Constitutional: He is oriented to person, place, and time. He appears well-developed and well-nourished. No distress.  HENT:  Head: Normocephalic and atraumatic.  Mouth/Throat: Oropharynx is clear and moist. No oropharyngeal exudate.  Eyes: Conjunctivae and EOM are normal. Pupils are equal, round, and reactive to light. Right eye exhibits no discharge. Left eye exhibits no discharge. No scleral icterus.  Neck: Normal range of motion. Neck supple. No JVD present. No tracheal deviation present. No thyromegaly present.  Cardiovascular: Normal rate, regular rhythm, normal heart sounds and intact distal pulses.  Exam reveals no gallop and no friction rub.   No murmur heard. Pulmonary/Chest: Effort normal and breath sounds normal. No stridor. No respiratory distress. He has no wheezes. He has no rales. He exhibits no tenderness.  Abdominal: Soft. Bowel sounds are normal. He exhibits no distension and no mass. There is no tenderness. There is no rebound and no guarding.  Musculoskeletal: Normal range of motion. He exhibits no edema and no tenderness.  Lymphadenopathy:    He has no cervical adenopathy.  Neurological: He is alert and oriented to person, place, and time. He has normal strength. He displays no atrophy, no tremor and normal reflexes. No cranial nerve deficit or sensory  deficit. He exhibits normal muscle tone. He  displays a negative Romberg sign. He displays no seizure activity. Coordination and gait normal. He displays no Babinski's sign on the right side. He displays no Babinski's sign on the left side.  Reflex Scores:      Tricep reflexes are 1+ on the right side and 1+ on the left side.      Bicep reflexes are 1+ on the right side and 1+ on the left side.      Brachioradialis reflexes are 1+ on the right side and 1+ on the left side.      Patellar reflexes are 1+ on the right side and 1+ on the left side.      Achilles reflexes are 1+ on the right side and 1+ on the left side. Skin: Skin is warm and dry. No rash noted. He is not diaphoretic. No erythema. No pallor.  Psychiatric: He has a normal mood and affect. His behavior is normal. Judgment and thought content normal.     Lab Results  Component Value Date   WBC 11.1* 01/09/2013   HGB 16.1 01/09/2013   HCT 48.3 01/09/2013   PLT 205.0 01/09/2013   GLUCOSE 87 01/09/2013   CHOL 171 01/09/2013   TRIG 72.0 01/09/2013   HDL 48.50 01/09/2013   LDLCALC 108* 01/09/2013   ALT 21 01/09/2013   AST 17 01/09/2013   NA 140 01/09/2013   K 3.6 01/09/2013   CL 105 01/09/2013   CREATININE 1.0 01/09/2013   BUN 14 01/09/2013   CO2 30 01/09/2013   TSH 0.81 01/09/2013   PSA 0.49 01/09/2013       Assessment & Plan:

## 2013-03-01 ENCOUNTER — Encounter: Payer: Self-pay | Admitting: Internal Medicine

## 2013-03-01 NOTE — Assessment & Plan Note (Signed)
I think his headache is related to OSA so I have asked him to be seen by sleep med for further evaluation

## 2013-04-07 ENCOUNTER — Encounter: Payer: Self-pay | Admitting: Pulmonary Disease

## 2013-04-07 ENCOUNTER — Ambulatory Visit (INDEPENDENT_AMBULATORY_CARE_PROVIDER_SITE_OTHER): Payer: BC Managed Care – PPO | Admitting: Pulmonary Disease

## 2013-04-07 ENCOUNTER — Encounter (INDEPENDENT_AMBULATORY_CARE_PROVIDER_SITE_OTHER): Payer: Self-pay

## 2013-04-07 VITALS — BP 122/90 | HR 64 | Temp 98.0°F | Ht 66.0 in | Wt 203.8 lb

## 2013-04-07 DIAGNOSIS — G478 Other sleep disorders: Secondary | ICD-10-CM

## 2013-04-07 NOTE — Progress Notes (Signed)
Subjective:    Patient ID: Jacob Cross, male    DOB: Sep 26, 1970, 42 y.o.   MRN: [id number]  HPI The patient is a 42 year old male who I've been asked to see for possible obstructive sleep apnea. The patient has been noted to have loud snoring, but does not have a consistent bed partner to describe his respiratory pattern. The patient typically works second shift, and does not get to bed until 2 AM or later, but sleeps until 12:30 PM. He feels that he sleeps very lightly, with frequent awakenings during the night of unknown origin. He has had rare checking arousals. He is not rested in the mornings whenever he arises, but describes only intermittent sleep pressure during the day. He will get sleepy in the evening watching television only if it is something that he is not interested in. He denies any sleepiness with driving. The patient states that his weight is down 40 pounds over the last 2 years, but has since leveled off and has been difficult to decrease further. His Epworth score today is abnormal at 13.   Sleep Questionnaire What time do you typically go to bed?( Between what hours) 2a-5am-- works 2nd shift 2a-5am-- works 2nd shift at 1104 on 04/07/13 by Maisie Fus, CMA How long does it take you to fall asleep? 1hr 1hr at 1104 on 04/07/13 by Maisie Fus, CMA How many times during the night do you wake up? 5 5 at 1104 on 04/07/13 by Maisie Fus, CMA What time do you get out of bed to start your day? 1230 1230 at 1104 on 04/07/13 by Maisie Fus, CMA Do you drive or operate heavy machinery in your occupation? No No at 1104 on 04/07/13 by Maisie Fus, CMA How much has your weight changed (up or down) over the past two years? (In pounds) 50 lb (22.68 kg)50 lb (22.68 kg) 40-50lb DECREASE at 1104 on 04/07/13 by Maisie Fus, CMA Have you ever had a sleep study before? No No at 1104 on 04/07/13 by Maisie Fus, CMA Do you currently use CPAP? No No at 1104 on  04/07/13 by Maisie Fus, CMA Do you wear oxygen at any time? No No at 1104 on 04/07/13 by Maisie Fus, CMA   Review of Systems  Constitutional: Negative for fever and unexpected weight change.  HENT: Positive for sneezing. Negative for congestion, dental problem, ear pain, nosebleeds, postnasal drip, rhinorrhea, sinus pressure, sore throat and trouble swallowing.   Eyes: Negative for redness and itching.  Respiratory: Positive for cough ( at nights). Negative for chest tightness, shortness of breath and wheezing.   Cardiovascular: Negative for palpitations and leg swelling.  Gastrointestinal: Negative for nausea and vomiting.  Genitourinary: Negative for dysuria.  Musculoskeletal: Negative for joint swelling.  Skin: Negative for rash.  Neurological: Positive for headaches.  Hematological: Does not bruise/bleed easily.  Psychiatric/Behavioral: Negative for dysphoric mood. The patient is not nervous/anxious.        Objective:   Physical Exam Constitutional:  Overweight male, no acute distress  HENT:  Nares patent without discharge, +enlarged turbinates.   Oropharynx without exudate, palate and uvula thick and elongated with narrowing posteriorly   Eyes:  Perrla, eomi, no scleral icterus  Neck:  No JVD, no TMG  Cardiovascular:  Normal rate, regular rhythm, no rubs or gallops.  No murmurs        Intact distal pulses  Pulmonary :  Normal breath sounds, no stridor or respiratory distress  No rales, rhonchi, or wheezing  Abdominal:  Soft, nondistended, bowel sounds present.  No tenderness noted.   Musculoskeletal:  No lower extremity edema noted.  Lymph Nodes:  No cervical lymphadenopathy noted  Skin:  No cyanosis noted  Neurologic:  Appears sleepy but appropriate. , moves all 4 extremities without obvious deficit.         Assessment & Plan:

## 2013-04-07 NOTE — Assessment & Plan Note (Signed)
The patient is having significant sleep disturbance and frequent awakenings that are impacting his quality of life. His history is suggestive of sleep apnea, although he may have other etiologies that are not obvious at this time. I had a long discussion with him about sleep apnea, including its impact to his quality of life and cardiovascular health. Will schedule for a sleep study for evaluation of his sleep, and will arrange for followup once the results are available.

## 2013-04-07 NOTE — Patient Instructions (Signed)
Will schedule for a sleep study, and arrange followup with me once the results are available.  Work on weight loss.  

## 2013-05-04 ENCOUNTER — Ambulatory Visit (HOSPITAL_BASED_OUTPATIENT_CLINIC_OR_DEPARTMENT_OTHER): Payer: BC Managed Care – PPO | Attending: Pulmonary Disease | Admitting: Radiology

## 2013-05-04 VITALS — Ht 66.0 in | Wt 200.0 lb

## 2013-05-04 DIAGNOSIS — G478 Other sleep disorders: Secondary | ICD-10-CM

## 2013-05-04 DIAGNOSIS — G4733 Obstructive sleep apnea (adult) (pediatric): Secondary | ICD-10-CM | POA: Insufficient documentation

## 2013-05-15 DIAGNOSIS — G478 Other sleep disorders: Secondary | ICD-10-CM

## 2013-05-15 DIAGNOSIS — G4733 Obstructive sleep apnea (adult) (pediatric): Secondary | ICD-10-CM

## 2013-05-15 NOTE — Sleep Study (Signed)
   NAME: Jacob Cross DATE OF BIRTH:  1970/12/11 MEDICAL RECORD NUMBER [id number]  LOCATION: [facility]  PHYSICIAN: Burr OF STUDY: 05/04/2013  SLEEP STUDY TYPE: Nocturnal Polysomnogram               REFERRING PHYSICIAN: Shankar Silber, Armando Reichert, MD  INDICATION FOR STUDY: Hypersomnia with sleep apnea  EPWORTH SLEEPINESS SCORE:  6 HEIGHT: 5' 6"  (167.6 cm)  WEIGHT: 200 lb (90.719 kg)    Body mass index is 32.3 kg/(m^2).  NECK SIZE: 17 in.  SLEEP ARCHITECTURE: The patient had a total sleep time of 321 minutes with very little slow-wave sleep and only 80 minutes of REM. Sleep onset latency was normal at 29 minutes, and REM onset was normal at 58 minutes. Sleep efficiency was mildly reduced at 86%.  RESPIRATORY DATA: The patient was found to have 29 apneas and 48 obstructive hypopneas, giving him an AHI of 14 events per hour. The vast majority of his events occurred during REM, and his REM AHI was 55 events per hour. The patient's obstructive events were not positional, and there was loud snoring noted throughout.  OXYGEN DATA: There was oxygen desaturation as low as 82% with his obstructive events.  CARDIAC DATA: No clinically significant arrhythmias were noted  MOVEMENT/PARASOMNIA: There were no significant leg jerks or other abnormal behavior seen.  IMPRESSION/ RECOMMENDATION:    1) mild obstructive sleep apnea/hypopnea syndrome, with an AHI of 14 events per hour and oxygen desaturation as low as 82%. However, the patient has a REM AHI of 55 events per hour. Treatment for this degree of sleep apnea can include a trial of weight loss alone, upper airway surgery, dental appliance, and also CPAP. Clinical correlation is suggested.     [place], American Board of Sleep Medicine  ELECTRONICALLY SIGNED ON:  05/15/2013, 10:09 AM [place] PH: [phone]   FX: [phone] [place]

## 2013-05-22 ENCOUNTER — Telehealth: Payer: Self-pay | Admitting: *Deleted

## 2013-05-22 NOTE — Telephone Encounter (Signed)
Pt needs appt with [facility] to review sleep study. LM x 1

## 2013-05-22 NOTE — Telephone Encounter (Signed)
Pt returned call.  Set appt w/ [facility] on 1/13 @ 10:15 AM.  Pt verbalized understanding & states nothing further needed at this time.  Satira Anis

## 2013-05-22 NOTE — Telephone Encounter (Signed)
Patient returning call.

## 2013-05-22 NOTE — Telephone Encounter (Signed)
Offered pt appt this afternoon at 4:15, pt declined stating that he would be at work.  [facility] has no openings this month, next available appt is 06/17/13.  Please advise if we can work pt in.

## 2013-05-22 NOTE — Telephone Encounter (Signed)
LM x 2 Pt can be worked in [place] this month. If cannot do any of the open 430 slots then can do first available

## 2013-05-26 ENCOUNTER — Encounter: Payer: Self-pay | Admitting: Pulmonary Disease

## 2013-05-26 ENCOUNTER — Ambulatory Visit (INDEPENDENT_AMBULATORY_CARE_PROVIDER_SITE_OTHER): Payer: BC Managed Care – PPO | Admitting: Pulmonary Disease

## 2013-05-26 VITALS — BP 120/70 | HR 68 | Temp 97.5°F | Ht 66.0 in | Wt 204.4 lb

## 2013-05-26 DIAGNOSIS — G4733 Obstructive sleep apnea (adult) (pediatric): Secondary | ICD-10-CM

## 2013-05-26 NOTE — Assessment & Plan Note (Signed)
The patient has mild obstructive sleep apnea by the actual numbers, however these occurred almost exclusively during REM, and therefore have a greater clinical impact to his quality of life. I have reviewed various treatment options with him, including a trial of weight loss alone, upper airway surgery, dental appliance, and also CPAP. I have recommended a trial of CPAP while working on weight loss. The patient is agreeable to this approach.  I will set the patient up on cpap at a moderate pressure level to allow for desensitization, and will troubleshoot the device over the next 4-6weeks if needed.  The pt is to call me if having issues with tolerance.  Will then optimize the pressure once patient is able to wear cpap on a consistent basis.

## 2013-05-26 NOTE — Patient Instructions (Signed)
Will start on cpap at a moderate pressure level.  Please call if you are having issues with tolerance. Work on weight loss followup with me again in 8 weeks.

## 2013-05-26 NOTE — Progress Notes (Signed)
   Subjective:    Patient ID: Jacob Cross, male    DOB: January 19, 1971, 43 y.o.   MRN: [id number]  HPI Patient comes in today for followup after his recent sleep study. He was found to have an AHI of 14 events per hour, however these occurred almost exclusively during REM with a REM AHI of 55 events per hour. I have reviewed the study with him in detail, and answered all of his questions.   Review of Systems  Constitutional: Negative for fever and unexpected weight change.  HENT: Negative for congestion, dental problem, ear pain, nosebleeds, postnasal drip, rhinorrhea, sinus pressure, sneezing, sore throat and trouble swallowing.   Eyes: Negative for redness and itching.  Respiratory: Negative for cough, chest tightness, shortness of breath and wheezing.   Cardiovascular: Negative for palpitations and leg swelling.  Gastrointestinal: Negative for nausea and vomiting.  Genitourinary: Negative for dysuria.  Musculoskeletal: Negative for joint swelling.  Skin: Negative for rash.  Neurological: Negative for headaches.  Hematological: Does not bruise/bleed easily.  Psychiatric/Behavioral: Negative for dysphoric mood. The patient is not nervous/anxious.        Objective:   Physical Exam Overweight male in no acute distress Nose without purulence or discharge noted Neck without lymphadenopathy or thyromegaly Lower extremities without edema, no cyanosis Awake, but appears to be sleepy, moves all 4 extremities.       Assessment & Plan:

## 2013-05-29 ENCOUNTER — Encounter: Payer: Self-pay | Admitting: Internal Medicine

## 2013-05-29 ENCOUNTER — Ambulatory Visit (INDEPENDENT_AMBULATORY_CARE_PROVIDER_SITE_OTHER): Payer: BC Managed Care – PPO | Admitting: Internal Medicine

## 2013-05-29 VITALS — BP 140/90 | HR 61 | Temp 97.5°F | Resp 16 | Wt 200.0 lb

## 2013-05-29 DIAGNOSIS — I10 Essential (primary) hypertension: Secondary | ICD-10-CM

## 2013-05-29 MED ORDER — AMLODIPINE BESYLATE 10 MG PO TABS: 10.0000 mg | ORAL_TABLET | Freq: Every day | ORAL | Status: DC

## 2013-05-29 NOTE — Progress Notes (Signed)
   Subjective:    Patient ID: Jacob Cross, male    DOB: [birth year], 43 y.o.   MRN: [id number]  Hypertension This is a new problem. The current episode started today. The problem is unchanged. The problem is uncontrolled. Pertinent negatives include no anxiety, blurred vision, chest pain, headaches, malaise/fatigue, neck pain, orthopnea, palpitations, peripheral edema, PND, shortness of breath or sweats. Risk factors for coronary artery disease include obesity. Past treatments include nothing. Compliance problems include diet and exercise.  Identifiable causes of hypertension include sleep apnea.      Review of Systems  Constitutional: Negative.  Negative for fever, chills, malaise/fatigue, diaphoresis and fatigue.  HENT: Negative.   Eyes: Negative.  Negative for blurred vision.  Respiratory: Positive for apnea. Negative for cough, choking, chest tightness, shortness of breath, wheezing and stridor.   Cardiovascular: Negative.  Negative for chest pain, palpitations, orthopnea and PND.  Gastrointestinal: Negative.  Negative for nausea, vomiting, abdominal pain, diarrhea and constipation.  Endocrine: Negative.   Genitourinary: Negative.   Musculoskeletal: Negative.  Negative for neck pain.  Skin: Negative.   Allergic/Immunologic: Negative.   Neurological: Negative.  Negative for dizziness and headaches.  Hematological: Negative.  Negative for adenopathy. Does not bruise/bleed easily.  Psychiatric/Behavioral: Negative.        Objective:   Physical Exam  Vitals reviewed. Constitutional: He is oriented to person, place, and time. He appears well-developed and well-nourished. No distress.  HENT:  Head: Normocephalic and atraumatic.  Mouth/Throat: Oropharynx is clear and moist. No oropharyngeal exudate.  Eyes: Conjunctivae are normal. Right eye exhibits no discharge. Left eye exhibits no discharge. No scleral icterus.  Neck: Normal range of motion. Neck supple. No JVD present. No tracheal  deviation present. No thyromegaly present.  Cardiovascular: Normal rate, regular rhythm, normal heart sounds and intact distal pulses.  Exam reveals no gallop and no friction rub.   No murmur heard. Pulmonary/Chest: Effort normal and breath sounds normal. No stridor. No respiratory distress. He has no wheezes. He has no rales. He exhibits no tenderness.  Abdominal: Soft. Bowel sounds are normal. He exhibits no distension and no mass. There is no tenderness. There is no rebound and no guarding.  Musculoskeletal: Normal range of motion. He exhibits no edema and no tenderness.  Lymphadenopathy:    He has no cervical adenopathy.  Neurological: He is oriented to person, place, and time.  Skin: Skin is warm and dry. No rash noted. He is not diaphoretic. No erythema. No pallor.  Psychiatric: He has a normal mood and affect. His behavior is normal. Judgment and thought content normal.     Lab Results  Component Value Date   WBC 11.1* 01/09/2013   HGB 16.1 01/09/2013   HCT 48.3 01/09/2013   PLT 205.0 01/09/2013   GLUCOSE 87 01/09/2013   CHOL 171 01/09/2013   TRIG 72.0 01/09/2013   HDL 48.50 01/09/2013   LDLCALC 108* 01/09/2013   ALT 21 01/09/2013   AST 17 01/09/2013   NA 140 01/09/2013   K 3.6 01/09/2013   CL 105 01/09/2013   CREATININE 1.0 01/09/2013   BUN 14 01/09/2013   CO2 30 01/09/2013   TSH 0.81 01/09/2013   PSA 0.49 01/09/2013       Assessment & Plan:

## 2013-05-29 NOTE — Progress Notes (Signed)
Pre visit review using our clinic review tool, if applicable. No additional management support is needed unless otherwise documented below in the visit note. 

## 2013-05-29 NOTE — Patient Instructions (Signed)

## 2013-05-30 ENCOUNTER — Telehealth: Payer: Self-pay | Admitting: Internal Medicine

## 2013-05-30 NOTE — Telephone Encounter (Signed)
Relevant patient education assigned to patient using Emmi. ° °

## 2013-05-31 NOTE — Assessment & Plan Note (Signed)
Will start treating with a CCB He will start treating the OSA and will work on his lifestyle modifications

## 2013-07-06 ENCOUNTER — Encounter: Payer: Self-pay | Admitting: Internal Medicine

## 2013-07-06 ENCOUNTER — Ambulatory Visit (INDEPENDENT_AMBULATORY_CARE_PROVIDER_SITE_OTHER): Payer: BC Managed Care – PPO | Admitting: Internal Medicine

## 2013-07-06 VITALS — BP 128/80 | HR 88 | Temp 98.7°F | Resp 12 | Wt 202.8 lb

## 2013-07-06 DIAGNOSIS — I10 Essential (primary) hypertension: Secondary | ICD-10-CM

## 2013-07-06 NOTE — Progress Notes (Signed)
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 He felt his heart was beating faster than normal. He did not have dizziness or blurred vision. BP was checked by nurse at [facility] and was 147/86 and 151/93 both left arm standing. Newly diagnosed with HTN and has been on norvasc 10 mg for 45 days. BP runs <140/90 with  wrist cuff.   ROS: Began CPAP 45 days ago for sleep apnea. Compliant 5 out of 7 nights with CPAP. Denies epistaxis and SOB. Exercises: treadmill 15 minutes twice a week and light weights. Diet: Avoids fried foods and acid containing foods (due to reflux). Doesn't add salt to foods. Will have eye exam in March.

## 2013-07-06 NOTE — Progress Notes (Signed)
Pre visit review using our clinic review tool, if applicable. No additional management support is needed unless otherwise documented below in the visit note. 

## 2013-07-06 NOTE — Patient Instructions (Signed)
Minimal Blood Pressure Goal= AVERAGE < 140/90;  Ideal is an AVERAGE < 135/85. This AVERAGE should be calculated from @ least 5-7 BP readings taken @ different times of day on different days of week. You should not respond to isolated BP readings , but rather the AVERAGE for that week .Please bring your  blood pressure cuff to office visits to verify that it is reliable.It  can also be checked against the blood pressure device at the pharmacy. Finger or wrist cuffs are not dependable; an arm cuff is.

## 2013-07-06 NOTE — Progress Notes (Signed)
   Subjective:    Patient ID: Jacob Cross, male    DOB: [birth year], 43 y.o.   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 He also felt his heart was beating faster than normal. He did not have dizziness or blurred vision. BP was checked by nurse at [facility] and was 147/86 and 151/93 both left arm standing. Newly diagnosed with HTN and has been on norvasc 10 mg for 45 days. Compliant with BP med. BP runs <140/90 with wrist cuff.      Family history of hypertension his mother and stroke in uncle        Review of Systems  Began CPAP 45 days ago for sleep apnea. Compliant 5 out of 7 nights with CPAP.  Denies epistaxis and SOB.  Exercises: treadmill 15 minutes twice a week and light weights.  Diet: Avoids fried foods and acid containing foods (due to reflux). Doesn't add salt to foods.  Will have eye exam in March.      Objective:   Physical Exam Appears healthy and well-nourished & in no acute distress  No carotid bruits are present.No neck pain distention present at 10 - 15 degrees. Thyroid normal to palpation  Heart rhythm and rate are normal with no significant murmurs or gallops.  Chest is clear with no increased work of breathing  There is no evidence of aortic aneurysm or renal artery bruits  Abdomen soft with no organomegaly or masses. No HJR  No clubbing, cyanosis or edema present.  Pedal pulses are intact   No ischemic skin changes are present . Nails healthy   Alert and oriented. Strength, tone, DTRs reflexes normal  Repeat blood pressures: 124/85 right arm and 110/85 left arm          Assessment & Plan:  #1 isolated mild hypertension, resolved  #2 bitemporal headache, resolved  Plan: Blood pressure goals and monitor discussed. If indeed he has exhibited to have paroxysmal hypertension; 24 urine for catecholamines and  metanephrines will be recommended.

## 2013-07-07 ENCOUNTER — Ambulatory Visit (INDEPENDENT_AMBULATORY_CARE_PROVIDER_SITE_OTHER): Payer: BC Managed Care – PPO | Admitting: Pulmonary Disease

## 2013-07-07 ENCOUNTER — Encounter: Payer: Self-pay | Admitting: Pulmonary Disease

## 2013-07-07 VITALS — BP 128/86 | HR 73 | Ht 66.0 in | Wt 202.0 lb

## 2013-07-07 DIAGNOSIS — G4733 Obstructive sleep apnea (adult) (pediatric): Secondary | ICD-10-CM

## 2013-07-07 NOTE — Assessment & Plan Note (Signed)
The patient overall has done well with CPAP initiation, and at this point we need to optimize his pressure. Will set his device on the automatic setting, and see how he responds. He is having some issues with mask leak intermittently, and we may have to consider a different type of mask. I've encouraged him to work aggressively on weight loss, and also to keep up with his mask cushion changes and supplies. I will see him back in 6 months if doing well.

## 2013-07-07 NOTE — Patient Instructions (Signed)
Will set her CPAP device on the automatic setting and see how he responds. Please call if having issues with tolerance. If you're mask continues to have intermittent leaking issues, we may have to consider a different type. Work aggressively on weight loss. Followup with me in 6 months, but call if having issues.

## 2013-07-07 NOTE — Progress Notes (Signed)
   Subjective:    Patient ID: Jacob Cross, male    DOB: 1971/01/13, 43 y.o.   MRN: [id number]  HPI The patient comes in today for followup of his obstructive sleep apnea. He is wearing CPAP compliantly, and is having no issues with his pressure., But overall is doing well. He has seen some improvement in his sleep and daytime alertness.   Review of Systems  Constitutional: Negative for fever and unexpected weight change.  HENT: Negative for congestion, dental problem, ear pain, nosebleeds, postnasal drip, rhinorrhea, sinus pressure, sneezing, sore throat and trouble swallowing.   Eyes: Negative for redness and itching.  Respiratory: Negative for cough, chest tightness, shortness of breath and wheezing.   Cardiovascular: Negative for palpitations and leg swelling.  Gastrointestinal: Positive for abdominal distention. Negative for nausea and vomiting.  Genitourinary: Negative for dysuria.  Musculoskeletal: Negative for joint swelling.  Skin: Negative for rash.  Neurological: Positive for headaches.  Hematological: Does not bruise/bleed easily.  Psychiatric/Behavioral: Negative for dysphoric mood. The patient is not nervous/anxious.        Objective:   Physical Exam Overweight male in no acute distress No skin breakdown or pressure necrosis from the CPAP mask Nose without purulence or discharge noted Neck without lymphadenopathy or thyromegaly Lower extremities without edema, no cyanosis Alert and oriented, moves all 4 extremities.       Assessment & Plan:

## 2013-08-28 ENCOUNTER — Encounter: Payer: Self-pay | Admitting: Internal Medicine

## 2013-08-28 ENCOUNTER — Ambulatory Visit (INDEPENDENT_AMBULATORY_CARE_PROVIDER_SITE_OTHER): Payer: BC Managed Care – PPO | Admitting: Internal Medicine

## 2013-08-28 VITALS — BP 128/94 | HR 89 | Temp 98.8°F | Resp 16 | Ht 66.0 in | Wt 206.2 lb

## 2013-08-28 DIAGNOSIS — I10 Essential (primary) hypertension: Secondary | ICD-10-CM

## 2013-08-28 DIAGNOSIS — N529 Male erectile dysfunction, unspecified: Secondary | ICD-10-CM

## 2013-08-28 MED ORDER — TADALAFIL 20 MG PO TABS: 20.0000 mg | ORAL_TABLET | Freq: Every day | ORAL | Status: DC | PRN

## 2013-08-28 MED ORDER — NEBIVOLOL HCL 5 MG PO TABS: 5.0000 mg | ORAL_TABLET | Freq: Every day | ORAL | Status: DC

## 2013-08-28 NOTE — Progress Notes (Signed)
Pre visit review using our clinic review tool, if applicable. No additional management support is needed unless otherwise documented below in the visit note. 

## 2013-08-28 NOTE — Progress Notes (Signed)
   Subjective:    Patient ID: Jacob Cross, male    DOB: Jan 25, 1971, 43 y.o.   MRN: [id number]  Hypertension This is a chronic problem. The current episode started more than 1 year ago. The problem is unchanged. The problem is uncontrolled. Pertinent negatives include no anxiety, blurred vision, chest pain, headaches, malaise/fatigue, neck pain, orthopnea, palpitations, peripheral edema, PND, shortness of breath or sweats. Past treatments include calcium channel blockers. The current treatment provides mild improvement. Compliance problems include exercise, diet and medication side effects.       Review of Systems  Constitutional: Negative.  Negative for fever, chills, malaise/fatigue, diaphoresis, appetite change and fatigue.  HENT: Negative.   Eyes: Negative.  Negative for blurred vision.  Respiratory: Negative.  Negative for cough, choking, chest tightness, shortness of breath, wheezing and stridor.   Cardiovascular: Negative.  Negative for chest pain, palpitations, orthopnea, leg swelling and PND.  Gastrointestinal: Negative.  Negative for nausea, vomiting, abdominal pain, diarrhea, constipation and blood in stool.  Endocrine: Negative.   Genitourinary: Negative.        He complains of ED  Musculoskeletal: Negative.  Negative for neck pain.  Skin: Negative.   Allergic/Immunologic: Negative.   Neurological: Negative.  Negative for dizziness, tremors, facial asymmetry, weakness, light-headedness, numbness and headaches.  Hematological: Negative.  Negative for adenopathy. Does not bruise/bleed easily.  Psychiatric/Behavioral: Negative.        Objective:   Physical Exam  Vitals reviewed. Constitutional: He is oriented to person, place, and time. He appears well-developed and well-nourished. No distress.  HENT:  Head: Normocephalic and atraumatic.  Mouth/Throat: Oropharynx is clear and moist. No oropharyngeal exudate.  Eyes: Conjunctivae are normal. Right eye exhibits no discharge.  Left eye exhibits no discharge. No scleral icterus.  Neck: Normal range of motion. Neck supple. No JVD present. No tracheal deviation present. No thyromegaly present.  Cardiovascular: Normal rate, regular rhythm, normal heart sounds and intact distal pulses.  Exam reveals no gallop and no friction rub.   No murmur heard. Pulmonary/Chest: Effort normal and breath sounds normal. No stridor. No respiratory distress. He has no wheezes. He has no rales. He exhibits no tenderness.  Abdominal: Soft. Bowel sounds are normal. He exhibits no distension and no mass. There is no tenderness. There is no rebound and no guarding.  Musculoskeletal: Normal range of motion. He exhibits no edema and no tenderness.  Lymphadenopathy:    He has no cervical adenopathy.  Neurological: He is oriented to person, place, and time.  Skin: Skin is warm and dry. No rash noted. He is not diaphoretic. No erythema. No pallor.  Psychiatric: He has a normal mood and affect. His behavior is normal. Judgment and thought content normal.          Assessment & Plan:

## 2013-08-28 NOTE — Assessment & Plan Note (Signed)
He wants to stop the CCB Will try to control his BP with bystolic

## 2013-08-28 NOTE — Assessment & Plan Note (Signed)
He believes that amlodipine has caused ED Will stop the CCB and he will try cialis

## 2013-08-28 NOTE — Patient Instructions (Signed)

## 2013-09-06 ENCOUNTER — Other Ambulatory Visit: Payer: Self-pay | Admitting: Internal Medicine

## 2013-09-10 ENCOUNTER — Other Ambulatory Visit: Payer: Self-pay | Admitting: Internal Medicine

## 2013-10-20 ENCOUNTER — Other Ambulatory Visit: Payer: Self-pay | Admitting: *Deleted

## 2013-10-20 DIAGNOSIS — I10 Essential (primary) hypertension: Secondary | ICD-10-CM

## 2013-10-20 MED ORDER — NEBIVOLOL HCL 5 MG PO TABS: 5.0000 mg | ORAL_TABLET | Freq: Every day | ORAL | Status: DC

## 2013-12-21 ENCOUNTER — Other Ambulatory Visit: Payer: Self-pay

## 2013-12-21 DIAGNOSIS — I10 Essential (primary) hypertension: Secondary | ICD-10-CM

## 2013-12-21 MED ORDER — NEBIVOLOL HCL 5 MG PO TABS: 5.0000 mg | ORAL_TABLET | Freq: Every day | ORAL | Status: DC

## 2014-01-04 ENCOUNTER — Encounter: Payer: Self-pay | Admitting: Pulmonary Disease

## 2014-01-04 ENCOUNTER — Ambulatory Visit (INDEPENDENT_AMBULATORY_CARE_PROVIDER_SITE_OTHER): Payer: BC Managed Care – PPO | Admitting: Pulmonary Disease

## 2014-01-04 VITALS — BP 118/78 | HR 71 | Temp 98.6°F | Ht 66.0 in | Wt 213.0 lb

## 2014-01-04 DIAGNOSIS — G4733 Obstructive sleep apnea (adult) (pediatric): Secondary | ICD-10-CM

## 2014-01-04 NOTE — Progress Notes (Signed)
   Subjective:    Patient ID: [name], male    DOB: 02-17-1971, 43 y.o.   MRN: [id number]  HPI Patient comes in today for followup of his obstructive sleep apnea. He has had great difficulty wearing CPAP regularly because of a poorly fitting mask. He has to pull the mask very tight in order to prevent leaks, but this leads to facial discomfort. It should be noted that his weight is up 11 pounds since last visit.   Review of Systems  Constitutional: Negative for fever and unexpected weight change.  HENT: Negative for congestion, dental problem, ear pain, nosebleeds, postnasal drip, rhinorrhea, sinus pressure, sneezing, sore throat and trouble swallowing.   Eyes: Negative for redness and itching.  Respiratory: Negative for cough, chest tightness, shortness of breath and wheezing.   Cardiovascular: Negative for palpitations and leg swelling.  Gastrointestinal: Negative for nausea and vomiting.  Genitourinary: Negative for dysuria.  Musculoskeletal: Negative for joint swelling.  Skin: Negative for rash.  Neurological: Negative for headaches.  Hematological: Does not bruise/bleed easily.  Psychiatric/Behavioral: Negative for dysphoric mood. The patient is not nervous/anxious.        Objective:   Physical Exam Overweight male in no acute distress Nose without purulence or discharge noted Neck without lymphadenopathy or thyromegaly No skin breakdown or pressure necrosis from the CPAP mask Lower extremities without edema, no cyanosis Patient appears very sleepy, but appropriate, moves all 4 extremities.       Assessment & Plan:

## 2014-01-04 NOTE — Assessment & Plan Note (Signed)
The patient has been trying to wear CPAP, but is having issues with his mask fit. It takes a lot of stress tension in order to prevent leaking, but this leads to discomfort. I think he will need referral to the sleep Center for a formal mask fitting , and I've also encouraged him to work aggressively on weight loss.

## 2014-01-04 NOTE — Patient Instructions (Signed)
Will refer to the sleep center for a mask fitting session. Work on [employer] loss Will see you back in one year if you are doing well, but need to hear from you if you are having difficulties wearing cpap consistently.

## 2014-01-06 ENCOUNTER — Ambulatory Visit (HOSPITAL_BASED_OUTPATIENT_CLINIC_OR_DEPARTMENT_OTHER): Payer: BC Managed Care – PPO | Attending: Pulmonary Disease | Admitting: Radiology

## 2014-01-06 DIAGNOSIS — G4733 Obstructive sleep apnea (adult) (pediatric): Secondary | ICD-10-CM

## 2014-01-06 DIAGNOSIS — Z9989 Dependence on other enabling machines and devices: Principal | ICD-10-CM

## 2014-03-01 ENCOUNTER — Encounter: Payer: Self-pay | Admitting: Internal Medicine

## 2014-03-01 ENCOUNTER — Ambulatory Visit (INDEPENDENT_AMBULATORY_CARE_PROVIDER_SITE_OTHER): Payer: BC Managed Care – PPO | Admitting: Internal Medicine

## 2014-03-01 VITALS — BP 120/76 | HR 73 | Temp 98.0°F | Resp 16 | Ht 66.0 in | Wt 215.0 lb

## 2014-03-01 DIAGNOSIS — K219 Gastro-esophageal reflux disease without esophagitis: Secondary | ICD-10-CM

## 2014-03-01 DIAGNOSIS — I10 Essential (primary) hypertension: Secondary | ICD-10-CM

## 2014-03-01 NOTE — Progress Notes (Signed)
   Subjective:    Patient ID: Jacob Cross., male    DOB: 01/18/1971, 43 y.o.   MRN: [id number]  Hypertension This is a chronic problem. The current episode started more than 1 year ago. The problem has been gradually improving since onset. The problem is controlled. Pertinent negatives include no anxiety, blurred vision, chest pain, headaches, malaise/fatigue, neck pain, orthopnea, palpitations, peripheral edema, PND, shortness of breath or sweats. Past treatments include beta blockers. The current treatment provides significant improvement. Compliance problems include diet and exercise.       Review of Systems  Constitutional: Negative.  Negative for malaise/fatigue.  HENT: Negative.   Eyes: Negative.  Negative for blurred vision.  Respiratory: Negative.  Negative for cough, choking, chest tightness, shortness of breath and stridor.   Cardiovascular: Negative.  Negative for chest pain, palpitations, orthopnea, leg swelling and PND.  Gastrointestinal: Negative.  Negative for vomiting, abdominal pain, diarrhea, constipation and blood in stool.  Endocrine: Negative.   Genitourinary: Negative.   Musculoskeletal: Negative.  Negative for neck pain.  Skin: Negative.   Allergic/Immunologic: Negative.   Neurological: Negative.  Negative for headaches.  Hematological: Negative.  Negative for adenopathy. Does not bruise/bleed easily.  Psychiatric/Behavioral: Negative.   All other systems reviewed and are negative.      Objective:   Physical Exam  Vitals reviewed. Constitutional: He is oriented to person, place, and time. He appears well-developed and well-nourished. No distress.  HENT:  Head: Normocephalic and atraumatic.  Mouth/Throat: Oropharynx is clear and moist. No oropharyngeal exudate.  Eyes: Conjunctivae are normal. Right eye exhibits no discharge. Left eye exhibits no discharge. No scleral icterus.  Neck: Normal range of motion. Neck supple. No JVD present. No tracheal  deviation present. No thyromegaly present.  Cardiovascular: Normal rate, regular rhythm, normal heart sounds and intact distal pulses.  Exam reveals no gallop and no friction rub.   No murmur heard. Pulmonary/Chest: Effort normal and breath sounds normal. No stridor. No respiratory distress. He has no wheezes. He has no rales. He exhibits no tenderness.  Abdominal: Soft. Bowel sounds are normal. He exhibits no distension. There is no tenderness. There is no rebound and no guarding.  Musculoskeletal: Normal range of motion. He exhibits no edema.  Lymphadenopathy:    He has no cervical adenopathy.  Neurological: He is oriented to person, place, and time.  Skin: Skin is warm and dry. No rash noted. He is not diaphoretic. No erythema. No pallor.     Lab Results  Component Value Date   WBC 11.1* 01/09/2013   HGB 16.1 01/09/2013   HCT 48.3 01/09/2013   PLT 205.0 01/09/2013   GLUCOSE 87 01/09/2013   CHOL 171 01/09/2013   TRIG 72.0 01/09/2013   HDL 48.50 01/09/2013   LDLCALC 108* 01/09/2013   ALT 21 01/09/2013   AST 17 01/09/2013   NA 140 01/09/2013   K 3.6 01/09/2013   CL 105 01/09/2013   CREATININE 1.0 01/09/2013   BUN 14 01/09/2013   CO2 30 01/09/2013   TSH 0.81 01/09/2013   PSA 0.49 01/09/2013       Assessment & Plan:

## 2014-03-01 NOTE — Progress Notes (Signed)
Pre visit review using our clinic review tool, if applicable. No additional management support is needed unless otherwise documented below in the visit note. 

## 2014-03-01 NOTE — Patient Instructions (Signed)

## 2014-03-02 ENCOUNTER — Encounter: Payer: Self-pay | Admitting: Internal Medicine

## 2014-03-02 NOTE — Assessment & Plan Note (Signed)
His BP is well controlled 

## 2014-07-19 ENCOUNTER — Other Ambulatory Visit: Payer: Self-pay | Admitting: Internal Medicine

## 2014-08-31 ENCOUNTER — Other Ambulatory Visit (INDEPENDENT_AMBULATORY_CARE_PROVIDER_SITE_OTHER): Payer: BLUE CROSS/BLUE SHIELD

## 2014-08-31 ENCOUNTER — Encounter: Payer: Self-pay | Admitting: Internal Medicine

## 2014-08-31 ENCOUNTER — Ambulatory Visit (INDEPENDENT_AMBULATORY_CARE_PROVIDER_SITE_OTHER): Payer: BLUE CROSS/BLUE SHIELD | Admitting: Internal Medicine

## 2014-08-31 VITALS — BP 118/84 | HR 52 | Temp 98.0°F | Resp 16 | Wt 215.0 lb

## 2014-08-31 DIAGNOSIS — I10 Essential (primary) hypertension: Secondary | ICD-10-CM

## 2014-08-31 DIAGNOSIS — Z0000 Encounter for general adult medical examination without abnormal findings: Secondary | ICD-10-CM

## 2014-08-31 DIAGNOSIS — K219 Gastro-esophageal reflux disease without esophagitis: Secondary | ICD-10-CM | POA: Diagnosis not present

## 2014-08-31 LAB — CBC WITH DIFFERENTIAL/PLATELET
BASOS PCT: 0.5 % (ref 0.0–3.0)
Basophils Absolute: 0 10*3/uL (ref 0.0–0.1)
EOS PCT: 2.6 % (ref 0.0–5.0)
Eosinophils Absolute: 0.2 10*3/uL (ref 0.0–0.7)
HCT: 49.8 % (ref 39.0–52.0)
Hemoglobin: 16.4 g/dL (ref 13.0–17.0)
Lymphocytes Relative: 25.9 % (ref 12.0–46.0)
Lymphs Abs: 2.4 10*3/uL (ref 0.7–4.0)
MCHC: 33 g/dL (ref 30.0–36.0)
MCV: 90.2 fl (ref 78.0–100.0)
MONO ABS: 0.8 10*3/uL (ref 0.1–1.0)
Monocytes Relative: 8.9 % (ref 3.0–12.0)
NEUTROS ABS: 5.8 10*3/uL (ref 1.4–7.7)
NEUTROS PCT: 62.1 % (ref 43.0–77.0)
Platelets: 193 10*3/uL (ref 150.0–400.0)
RBC: 5.51 Mil/uL (ref 4.22–5.81)
RDW: 14.8 % (ref 11.5–15.5)
WBC: 9.4 10*3/uL (ref 4.0–10.5)

## 2014-08-31 LAB — TSH: TSH: 0.54 u[IU]/mL (ref 0.35–4.50)

## 2014-08-31 LAB — LIPID PANEL
Cholesterol: 190 mg/dL (ref 0–200)
HDL: 47.3 mg/dL (ref >39.00)
LDL Cholesterol: 119 mg/dL — ABNORMAL HIGH (ref 0–99)
NonHDL: 142.7
TRIGLYCERIDES: 119 mg/dL (ref 0.0–149.0)
Total CHOL/HDL Ratio: 4
VLDL: 23.8 mg/dL (ref 0.0–40.0)

## 2014-08-31 LAB — COMPREHENSIVE METABOLIC PANEL
ALBUMIN: 4.6 g/dL (ref 3.5–5.2)
ALK PHOS: 53 U/L (ref 39–117)
ALT: 25 U/L (ref 0–53)
AST: 19 U/L (ref 0–37)
BUN: 14 mg/dL (ref 6–23)
CALCIUM: 9.4 mg/dL (ref 8.4–10.5)
CO2: 32 mEq/L (ref 19–32)
Chloride: 103 mEq/L (ref 96–112)
Creatinine, Ser: 1.28 mg/dL (ref 0.40–1.50)
GFR: 64.93 mL/min (ref >60.00)
Glucose, Bld: 93 mg/dL (ref 70–99)
POTASSIUM: 4.3 meq/L (ref 3.5–5.1)
Sodium: 139 mEq/L (ref 135–145)
Total Bilirubin: 0.8 mg/dL (ref 0.2–1.2)
Total Protein: 6.8 g/dL (ref 6.0–8.3)

## 2014-08-31 LAB — PSA: PSA: 0.54 ng/mL (ref 0.10–4.00)

## 2014-08-31 MED ORDER — RANITIDINE HCL 300 MG PO TABS: 300.0000 mg | ORAL_TABLET | Freq: Every day | ORAL | Status: DC

## 2014-08-31 NOTE — Assessment & Plan Note (Signed)
Will change nexium to generic zantac

## 2014-08-31 NOTE — Assessment & Plan Note (Signed)
Exam done Vaccines were reviewed Labs ordered Pt ed material was given

## 2014-08-31 NOTE — Assessment & Plan Note (Signed)
His BP is well controlled Lytes and renal function are normal

## 2014-08-31 NOTE — Progress Notes (Signed)
Subjective:    Patient ID: Jacob Revel., male    DOB: Sep 22, 1970, 44 y.o.   MRN: [id number]  HPI Comments: He complains that nexium is too expensive  Gastrophageal Reflux He reports no abdominal pain, no belching, no chest pain, no choking, no coughing, no dysphagia, no early satiety, no globus sensation, no heartburn, no hoarse voice, no nausea, no sore throat, no stridor, no tooth decay, no water brash or no wheezing. This is a chronic problem. The problem occurs occasionally. The problem has been gradually improving. Nothing aggravates the symptoms. Pertinent negatives include no anemia, fatigue, melena, muscle weakness, orthopnea or weight loss. He has tried a PPI for the symptoms. The treatment provided significant relief.      Review of Systems  Constitutional: Negative.  Negative for fever, chills, weight loss, diaphoresis, appetite change and fatigue.  HENT: Negative.  Negative for hoarse voice and sore throat.   Eyes: Negative.   Respiratory: Positive for apnea. Negative for cough, choking, chest tightness, shortness of breath, wheezing and stridor.   Cardiovascular: Negative.  Negative for chest pain, palpitations and leg swelling.  Gastrointestinal: Negative.  Negative for heartburn, dysphagia, nausea, vomiting, abdominal pain, diarrhea, constipation, blood in stool and melena.  Endocrine: Negative.   Genitourinary: Negative.  Negative for dysuria, urgency, frequency, flank pain, decreased urine volume and difficulty urinating.  Musculoskeletal: Negative.  Negative for muscle weakness.  Skin: Negative.  Negative for rash.  Allergic/Immunologic: Negative.   Neurological: Negative.   Hematological: Negative.  Negative for adenopathy. Does not bruise/bleed easily.  Psychiatric/Behavioral: Negative.        Objective:   Physical Exam  Constitutional: He is oriented to person, place, and time. He appears well-developed and well-nourished. No distress.  HENT:  Head:  Normocephalic and atraumatic.  Mouth/Throat: Oropharynx is clear and moist. No oropharyngeal exudate.  Eyes: Conjunctivae are normal. Right eye exhibits no discharge. Left eye exhibits no discharge. No scleral icterus.  Neck: Normal range of motion. Neck supple. No JVD present. No tracheal deviation present. No thyromegaly present.  Cardiovascular: Normal rate, regular rhythm, normal heart sounds and intact distal pulses.  Exam reveals no gallop and no friction rub.   No murmur heard. Pulmonary/Chest: Effort normal and breath sounds normal. No stridor. No respiratory distress. He has no wheezes. He has no rales. He exhibits no tenderness.  Abdominal: Soft. Bowel sounds are normal. He exhibits no distension and no mass. There is no tenderness. There is no rebound and no guarding. Hernia confirmed negative in the right inguinal area and confirmed negative in the left inguinal area.  Genitourinary: Rectum normal, testes normal and penis normal. Rectal exam shows no external hemorrhoid, no internal hemorrhoid, no fissure, no mass, no tenderness and anal tone normal. Guaiac negative stool. Prostate is enlarged (1+ smooth symm BPH). Prostate is not tender. Right testis shows no mass, no swelling and no tenderness. Right testis is descended. Left testis shows no mass, no swelling and no tenderness. Left testis is descended. Circumcised. No penile erythema or penile tenderness. No discharge found.  Musculoskeletal: Normal range of motion. He exhibits no edema or tenderness.  Lymphadenopathy:    He has no cervical adenopathy.       Right: No inguinal adenopathy present.       Left: No inguinal adenopathy present.  Neurological: He is oriented to person, place, and time.  Skin: Skin is warm and dry. No rash noted. He is not diaphoretic. No erythema. No pallor.  Psychiatric: He  has a normal mood and affect. His behavior is normal. Judgment and thought content normal.  Vitals reviewed.   Lab Results    Component Value Date   WBC 9.4 08/31/2014   HGB 16.4 08/31/2014   HCT 49.8 08/31/2014   PLT 193.0 08/31/2014   GLUCOSE 93 08/31/2014   CHOL 190 08/31/2014   TRIG 119.0 08/31/2014   HDL 47.30 08/31/2014   LDLCALC 119* 08/31/2014   ALT 25 08/31/2014   AST 19 08/31/2014   NA 139 08/31/2014   K 4.3 08/31/2014   CL 103 08/31/2014   CREATININE 1.28 08/31/2014   BUN 14 08/31/2014   CO2 32 08/31/2014   TSH 0.54 08/31/2014   PSA 0.54 08/31/2014        Assessment & Plan:

## 2014-08-31 NOTE — Progress Notes (Signed)
Pre visit review using our clinic review tool, if applicable. No additional management support is needed unless otherwise documented below in the visit note. 

## 2014-08-31 NOTE — Patient Instructions (Signed)

## 2014-10-27 ENCOUNTER — Other Ambulatory Visit: Payer: Self-pay

## 2014-10-27 MED ORDER — TADALAFIL 20 MG PO TABS: 20.0000 mg | ORAL_TABLET | Freq: Every day | ORAL | Status: DC | PRN

## 2014-11-05 ENCOUNTER — Other Ambulatory Visit: Payer: Self-pay

## 2014-11-06 ENCOUNTER — Other Ambulatory Visit: Payer: Self-pay | Admitting: Internal Medicine

## 2014-11-06 MED ORDER — TADALAFIL 20 MG PO TABS: 20.0000 mg | ORAL_TABLET | Freq: Every day | ORAL | Status: DC | PRN

## 2015-01-21 ENCOUNTER — Telehealth: Payer: Self-pay | Admitting: *Deleted

## 2015-01-21 MED ORDER — NEBIVOLOL HCL 5 MG PO TABS: ORAL_TABLET | ORAL | Status: DC

## 2015-01-21 NOTE — Telephone Encounter (Signed)
Left msg on triage pharmacy has sent requests to have his bystolic refill and they have not heard response back. Called pt inform him per chart there are no request sent from [facility], but will send refill electronically. Saw md back on April cpx...Johny Chess

## 2015-03-21 ENCOUNTER — Telehealth: Payer: Self-pay | Admitting: *Deleted

## 2015-03-21 MED ORDER — NEBIVOLOL HCL 5 MG PO TABS: ORAL_TABLET | ORAL | Status: DC

## 2015-03-21 NOTE — Telephone Encounter (Signed)
Receive call wanting to get a 90 day on his Bystolic. Sent electronically to [facility]..../lmb

## 2015-06-19 ENCOUNTER — Other Ambulatory Visit: Payer: Self-pay | Admitting: Internal Medicine

## 2015-06-22 ENCOUNTER — Other Ambulatory Visit: Payer: Self-pay | Admitting: Internal Medicine

## 2015-11-09 ENCOUNTER — Other Ambulatory Visit: Payer: Self-pay | Admitting: Internal Medicine

## 2015-11-17 ENCOUNTER — Ambulatory Visit (INDEPENDENT_AMBULATORY_CARE_PROVIDER_SITE_OTHER): Payer: BLUE CROSS/BLUE SHIELD | Admitting: Internal Medicine

## 2015-11-17 ENCOUNTER — Other Ambulatory Visit (INDEPENDENT_AMBULATORY_CARE_PROVIDER_SITE_OTHER): Payer: BLUE CROSS/BLUE SHIELD

## 2015-11-17 ENCOUNTER — Encounter: Payer: Self-pay | Admitting: Internal Medicine

## 2015-11-17 VITALS — BP 130/80 | HR 66 | Temp 97.9°F | Resp 16 | Ht 66.0 in | Wt 219.0 lb

## 2015-11-17 DIAGNOSIS — Z0000 Encounter for general adult medical examination without abnormal findings: Secondary | ICD-10-CM

## 2015-11-17 DIAGNOSIS — N522 Drug-induced erectile dysfunction: Secondary | ICD-10-CM

## 2015-11-17 DIAGNOSIS — R7989 Other specified abnormal findings of blood chemistry: Secondary | ICD-10-CM

## 2015-11-17 DIAGNOSIS — I10 Essential (primary) hypertension: Secondary | ICD-10-CM

## 2015-11-17 DIAGNOSIS — J301 Allergic rhinitis due to pollen: Secondary | ICD-10-CM | POA: Diagnosis not present

## 2015-11-17 LAB — CBC WITH DIFFERENTIAL/PLATELET
Basophils Absolute: 0 10*3/uL (ref 0.0–0.1)
Basophils Relative: 0.4 % (ref 0.0–3.0)
EOS PCT: 2.7 % (ref 0.0–5.0)
Eosinophils Absolute: 0.3 10*3/uL (ref 0.0–0.7)
HCT: 46.7 % (ref 39.0–52.0)
Hemoglobin: 15.5 g/dL (ref 13.0–17.0)
LYMPHS ABS: 2.6 10*3/uL (ref 0.7–4.0)
Lymphocytes Relative: 22.5 % (ref 12.0–46.0)
MCHC: 33.2 g/dL (ref 30.0–36.0)
MCV: 90.6 fl (ref 78.0–100.0)
MONOS PCT: 9 % (ref 3.0–12.0)
Monocytes Absolute: 1 10*3/uL (ref 0.1–1.0)
NEUTROS ABS: 7.5 10*3/uL (ref 1.4–7.7)
NEUTROS PCT: 65.4 % (ref 43.0–77.0)
Platelets: 183 10*3/uL (ref 150.0–400.0)
RBC: 5.15 Mil/uL (ref 4.22–5.81)
RDW: 14.4 % (ref 11.5–15.5)
WBC: 11.5 10*3/uL — ABNORMAL HIGH (ref 4.0–10.5)

## 2015-11-17 LAB — URINALYSIS, ROUTINE W REFLEX MICROSCOPIC
BILIRUBIN URINE: NEGATIVE
HGB URINE DIPSTICK: NEGATIVE
KETONES UR: NEGATIVE
LEUKOCYTES UA: NEGATIVE
Nitrite: NEGATIVE
PH: 6 (ref 5.0–8.0)
RBC / HPF: NONE SEEN (ref 0–?)
Specific Gravity, Urine: 1.02 (ref 1.000–1.030)
Total Protein, Urine: NEGATIVE
Urine Glucose: NEGATIVE
Urobilinogen, UA: 0.2 (ref 0.0–1.0)
WBC, UA: NONE SEEN (ref 0–?)

## 2015-11-17 LAB — COMPREHENSIVE METABOLIC PANEL
ALT: 22 U/L (ref 0–53)
AST: 14 U/L (ref 0–37)
Albumin: 4.3 g/dL (ref 3.5–5.2)
Alkaline Phosphatase: 51 U/L (ref 39–117)
BUN: 14 mg/dL (ref 6–23)
CHLORIDE: 105 meq/L (ref 96–112)
CO2: 29 meq/L (ref 19–32)
Calcium: 9.1 mg/dL (ref 8.4–10.5)
Creatinine, Ser: 1.21 mg/dL (ref 0.40–1.50)
GFR: 68.91 mL/min (ref >60.00)
GLUCOSE: 95 mg/dL (ref 70–99)
POTASSIUM: 4 meq/L (ref 3.5–5.1)
Sodium: 141 mEq/L (ref 135–145)
Total Bilirubin: 0.3 mg/dL (ref 0.2–1.2)
Total Protein: 6.4 g/dL (ref 6.0–8.3)

## 2015-11-17 LAB — LIPID PANEL
CHOL/HDL RATIO: 4
Cholesterol: 192 mg/dL (ref 0–200)
HDL: 43.4 mg/dL (ref >39.00)
NONHDL: 148.37
TRIGLYCERIDES: 285 mg/dL — AB (ref 0.0–149.0)
VLDL: 57 mg/dL — ABNORMAL HIGH (ref 0.0–40.0)

## 2015-11-17 LAB — FECAL OCCULT BLOOD, GUAIAC: Fecal Occult Blood: NEGATIVE

## 2015-11-17 LAB — LDL CHOLESTEROL, DIRECT: LDL DIRECT: 120 mg/dL

## 2015-11-17 LAB — PSA: PSA: 0.57 ng/mL (ref 0.10–4.00)

## 2015-11-17 LAB — TSH: TSH: 1.25 u[IU]/mL (ref 0.35–4.50)

## 2015-11-17 MED ORDER — TADALAFIL 20 MG PO TABS: 20.0000 mg | ORAL_TABLET | Freq: Every day | ORAL | Status: DC | PRN

## 2015-11-17 MED ORDER — AZELASTINE-FLUTICASONE 137-50 MCG/ACT NA SUSP: 1.0000 | Freq: Two times a day (BID) | NASAL | Status: DC

## 2015-11-17 NOTE — Patient Instructions (Signed)

## 2015-11-17 NOTE — Progress Notes (Signed)
Subjective:  Patient ID: Jacob Cross., male    DOB: 1970-07-10  Age: 45 y.o. MRN: [id number]  CC: Annual Exam; Hypertension; and Allergic Rhinitis    HPI Jacob Cross. presents for a CPX.  He complains of allergy symptoms. He has allergy symptoms year-round but they have worsened over the last few months. He complains of nasal congestion, runny nose, postnasal drip, and sneezing. He has tried [employer] without much relief from his symptoms. He denies facial pain, earaches, blood in the nose, fever, or chills. He does not take decongestants.  He tells me his blood pressure has been well controlled on nebivolol. He denies palpitations, chest pain, shortness of breath, edema, near syncope, or fatigue.  Outpatient Prescriptions Prior to Visit  Medication Sig Dispense Refill  . BYSTOLIC 5 MG tablet TAKE 1 TABLET (5 MG TOTAL) BY MOUTH DAILY. 30 tablet 2  . ranitidine (ZANTAC) 300 MG tablet Take 1 tablet (300 mg total) by mouth at bedtime. 90 tablet 3  . BYSTOLIC 5 MG tablet TAKE 1 TABLET (5 MG TOTAL) BY MOUTH DAILY. 30 tablet 2  . nebivolol (BYSTOLIC) 5 MG tablet TAKE 1 TABLET (5 MG TOTAL) BY MOUTH DAILY. 90 tablet 2  . tadalafil (CIALIS) 20 MG tablet Take 1 tablet (20 mg total) by mouth daily as needed for erectile dysfunction. 8 tablet 11   No facility-administered medications prior to visit.    ROS Review of Systems  Constitutional: Negative.  Negative for fever, chills, diaphoresis, appetite change and fatigue.  HENT: Positive for congestion, postnasal drip, rhinorrhea and sneezing. Negative for dental problem, ear pain, facial swelling, nosebleeds, sinus pressure, sore throat, tinnitus, trouble swallowing and voice change.   Eyes: Negative.   Respiratory: Negative.  Negative for cough, choking, chest tightness, shortness of breath and stridor.   Cardiovascular: Negative.  Negative for chest pain, palpitations and leg swelling.  Gastrointestinal: Negative.   Negative for abdominal pain.  Endocrine: Negative.   Genitourinary: Negative.  Negative for difficulty urinating.  Musculoskeletal: Negative.   Allergic/Immunologic: Negative.   Neurological: Negative.  Negative for dizziness and weakness.  Hematological: Negative.  Negative for adenopathy. Does not bruise/bleed easily.  Psychiatric/Behavioral: Negative.     Objective:  BP 130/80 mmHg  Pulse 66  Temp(Src) 97.9 F (36.6 C) (Oral)  Resp 16  Ht 5' 6"  (1.676 m)  Wt 219 lb (99.338 kg)  BMI 35.36 kg/m2  SpO2 98%  BP Readings from Last 3 Encounters:  11/17/15 130/80  08/31/14 118/84  03/01/14 120/76    Wt Readings from Last 3 Encounters:  11/17/15 219 lb (99.338 kg)  08/31/14 215 lb (97.523 kg)  03/01/14 215 lb (97.523 kg)    Physical Exam  Constitutional: He is oriented to person, place, and time. No distress.  HENT:  Head: Normocephalic and atraumatic.  Nose: Mucosal edema and rhinorrhea present. No sinus tenderness, nasal deformity or nasal septal hematoma. No epistaxis. Right sinus exhibits no maxillary sinus tenderness and no frontal sinus tenderness. Left sinus exhibits no maxillary sinus tenderness and no frontal sinus tenderness.  Mouth/Throat: Oropharynx is clear and moist and mucous membranes are normal. Mucous membranes are not pale, not dry and not cyanotic. No trismus in the jaw. No uvula swelling. No oropharyngeal exudate, posterior oropharyngeal edema, posterior oropharyngeal erythema or tonsillar abscesses.  Eyes: Conjunctivae are normal. Right eye exhibits no discharge. Left eye exhibits no discharge. No scleral icterus.  Neck: Normal range of motion. Neck supple. No JVD present.  No tracheal deviation present. No thyromegaly present.  Cardiovascular: Normal rate, regular rhythm, normal heart sounds and intact distal pulses.  Exam reveals no gallop and no friction rub.   No murmur heard. Pulmonary/Chest: Effort normal and breath sounds normal. No stridor. No  respiratory distress. He has no wheezes. He has no rales. He exhibits no tenderness.  Abdominal: Soft. Bowel sounds are normal. He exhibits no distension and no mass. There is no tenderness. There is no rebound and no guarding. Hernia confirmed negative in the right inguinal area and confirmed negative in the left inguinal area.  Genitourinary: Rectum normal, prostate normal, testes normal and penis normal. Rectal exam shows no external hemorrhoid, no internal hemorrhoid, no fissure, no mass, no tenderness and anal tone normal. Guaiac negative stool. Prostate is not enlarged and not tender. Right testis shows no mass, no swelling and no tenderness. Right testis is descended. Left testis shows no mass, no swelling and no tenderness. Left testis is descended. Circumcised. No penile erythema or penile tenderness. No discharge found.  Musculoskeletal: Normal range of motion. He exhibits no edema or tenderness.  Lymphadenopathy:    He has no cervical adenopathy.       Right: No inguinal adenopathy present.       Left: No inguinal adenopathy present.  Neurological: He is oriented to person, place, and time.  Skin: Skin is warm and dry. No rash noted. He is not diaphoretic. No erythema. No pallor.  Vitals reviewed.   Lab Results  Component Value Date   WBC 11.5* 11/17/2015   HGB 15.5 11/17/2015   HCT 46.7 11/17/2015   PLT 183.0 11/17/2015   GLUCOSE 95 11/17/2015   CHOL 192 11/17/2015   TRIG 285.0* 11/17/2015   HDL 43.40 11/17/2015   LDLDIRECT 120.0 11/17/2015   LDLCALC 119* 08/31/2014   ALT 22 11/17/2015   AST 14 11/17/2015   NA 141 11/17/2015   K 4.0 11/17/2015   CL 105 11/17/2015   CREATININE 1.21 11/17/2015   BUN 14 11/17/2015   CO2 29 11/17/2015   TSH 1.25 11/17/2015   PSA 0.57 11/17/2015    No results found.  Assessment & Plan:   Shawnte was seen today for annual exam, hypertension and allergic rhinitis .  Diagnoses and all orders for this visit:  Routine general medical  examination at a health care facility- Exam completed, labs ordered and reviewed, vaccines were reviewed, he was given patient education material. -     Lipid panel; Future -     Comprehensive metabolic panel; Future -     CBC with Differential/Platelet; Future -     TSH; Future -     Urinalysis, Routine w reflex microscopic (not at [facility]); Future -     PSA; Future -     HIV antibody; Future  Drug-induced erectile dysfunction- I will treat this with Cialis -     tadalafil (CIALIS) 20 MG tablet; Take 1 tablet (20 mg total) by mouth daily as needed for erectile dysfunction.  Essential hypertension, benign- his blood pressure is well-controlled, electrolytes and renal function are normal, will continue nebivolol at the current dose.  Allergic rhinitis due to pollen- he can continue taking Allegra as needed but we will upgrade the Flonase to Dymista to treat him a nasal antihistamine as well as a nasal steroid. -     Azelastine-Fluticasone (DYMISTA) 137-50 MCG/ACT SUSP; Place 1 Act into the nose 2 (two) times daily.   I have discontinued Mr. Gadea's nebivolol. I am  also having him start on Azelastine-Fluticasone. Additionally, I am having him maintain his ranitidine, BYSTOLIC, esomeprazole, and tadalafil.  Meds ordered this encounter  Medications  . esomeprazole (NEXIUM) 40 MG capsule    Sig: Take 1 capsule by mouth as needed.  . tadalafil (CIALIS) 20 MG tablet    Sig: Take 1 tablet (20 mg total) by mouth daily as needed for erectile dysfunction.    Dispense:  8 tablet    Refill:  11  . Azelastine-Fluticasone (DYMISTA) 137-50 MCG/ACT SUSP    Sig: Place 1 Act into the nose 2 (two) times daily.    Dispense:  1 Bottle    Refill:  11     Follow-up: Return in about 6 months (around 05/19/2016).  Scarlette Calico, MD

## 2015-11-17 NOTE — Progress Notes (Signed)
Pre visit review using our clinic review tool, if applicable. No additional management support is needed unless otherwise documented below in the visit note. 

## 2015-11-18 ENCOUNTER — Encounter: Payer: Self-pay | Admitting: Internal Medicine

## 2015-11-18 LAB — HIV ANTIBODY (ROUTINE TESTING W REFLEX): HIV 1&2 Ab, 4th Generation: NONREACTIVE

## 2015-12-08 ENCOUNTER — Other Ambulatory Visit: Payer: Self-pay | Admitting: Internal Medicine

## 2016-03-17 ENCOUNTER — Other Ambulatory Visit: Payer: Self-pay | Admitting: Internal Medicine

## 2016-11-19 ENCOUNTER — Ambulatory Visit (INDEPENDENT_AMBULATORY_CARE_PROVIDER_SITE_OTHER): Payer: BLUE CROSS/BLUE SHIELD | Admitting: Internal Medicine

## 2016-11-19 ENCOUNTER — Other Ambulatory Visit (INDEPENDENT_AMBULATORY_CARE_PROVIDER_SITE_OTHER): Payer: BLUE CROSS/BLUE SHIELD

## 2016-11-19 ENCOUNTER — Encounter: Payer: Self-pay | Admitting: Internal Medicine

## 2016-11-19 VITALS — BP 124/78 | HR 56 | Temp 97.9°F | Resp 16 | Ht 66.0 in | Wt 226.0 lb

## 2016-11-19 DIAGNOSIS — I10 Essential (primary) hypertension: Secondary | ICD-10-CM

## 2016-11-19 DIAGNOSIS — J301 Allergic rhinitis due to pollen: Secondary | ICD-10-CM | POA: Diagnosis not present

## 2016-11-19 DIAGNOSIS — N400 Enlarged prostate without lower urinary tract symptoms: Secondary | ICD-10-CM | POA: Diagnosis not present

## 2016-11-19 DIAGNOSIS — Z0000 Encounter for general adult medical examination without abnormal findings: Secondary | ICD-10-CM

## 2016-11-19 LAB — CBC WITH DIFFERENTIAL/PLATELET
BASOS PCT: 1.2 % (ref 0.0–3.0)
Basophils Absolute: 0.1 10*3/uL (ref 0.0–0.1)
EOS ABS: 0.5 10*3/uL (ref 0.0–0.7)
EOS PCT: 4.1 % (ref 0.0–5.0)
HEMATOCRIT: 49.4 % (ref 39.0–52.0)
HEMOGLOBIN: 16.3 g/dL (ref 13.0–17.0)
LYMPHS PCT: 31.5 % (ref 12.0–46.0)
Lymphs Abs: 3.6 10*3/uL (ref 0.7–4.0)
MCHC: 33 g/dL (ref 30.0–36.0)
MCV: 92 fl (ref 78.0–100.0)
Monocytes Absolute: 1 10*3/uL (ref 0.1–1.0)
Monocytes Relative: 8.9 % (ref 3.0–12.0)
Neutro Abs: 6.1 10*3/uL (ref 1.4–7.7)
Neutrophils Relative %: 54.3 % (ref 43.0–77.0)
Platelets: 178 10*3/uL (ref 150.0–400.0)
RBC: 5.37 Mil/uL (ref 4.22–5.81)
RDW: 14.4 % (ref 11.5–15.5)
WBC: 11.3 10*3/uL — AB (ref 4.0–10.5)

## 2016-11-19 LAB — TSH: TSH: 1.72 u[IU]/mL (ref 0.35–4.50)

## 2016-11-19 LAB — LIPID PANEL
CHOL/HDL RATIO: 4
Cholesterol: 199 mg/dL (ref 0–200)
HDL: 48.6 mg/dL (ref >39.00)
LDL CALC: 112 mg/dL — AB (ref 0–99)
NonHDL: 150.61
TRIGLYCERIDES: 191 mg/dL — AB (ref 0.0–149.0)
VLDL: 38.2 mg/dL (ref 0.0–40.0)

## 2016-11-19 LAB — COMPREHENSIVE METABOLIC PANEL
ALBUMIN: 4.7 g/dL (ref 3.5–5.2)
ALT: 36 U/L (ref 0–53)
AST: 20 U/L (ref 0–37)
Alkaline Phosphatase: 58 U/L (ref 39–117)
BUN: 13 mg/dL (ref 6–23)
CALCIUM: 9.9 mg/dL (ref 8.4–10.5)
CHLORIDE: 100 meq/L (ref 96–112)
CO2: 33 mEq/L — ABNORMAL HIGH (ref 19–32)
Creatinine, Ser: 1.21 mg/dL (ref 0.40–1.50)
GFR: 68.6 mL/min (ref >60.00)
Glucose, Bld: 84 mg/dL (ref 70–99)
POTASSIUM: 4.2 meq/L (ref 3.5–5.1)
Sodium: 139 mEq/L (ref 135–145)
Total Bilirubin: 0.4 mg/dL (ref 0.2–1.2)
Total Protein: 6.9 g/dL (ref 6.0–8.3)

## 2016-11-19 LAB — PSA: PSA: 0.55 ng/mL (ref 0.10–4.00)

## 2016-11-19 MED ORDER — AZELASTINE-FLUTICASONE 137-50 MCG/ACT NA SUSP: 1.0000 | Freq: Two times a day (BID) | NASAL | 3 refills | Status: DC

## 2016-11-19 MED ORDER — LEVOCETIRIZINE DIHYDROCHLORIDE 5 MG PO TABS: 5.0000 mg | ORAL_TABLET | Freq: Every evening | ORAL | 3 refills | Status: DC

## 2016-11-19 NOTE — Patient Instructions (Signed)

## 2016-11-19 NOTE — Progress Notes (Signed)
Pre visit review using our clinic review tool, if applicable. No additional management support is needed unless otherwise documented below in the visit note. 

## 2016-11-19 NOTE — Progress Notes (Signed)
Subjective:  Patient ID: Jacob Revel., male    DOB: Sep 15, 1970  Age: 46 y.o. MRN: [id number]  CC: Hypertension; Hyperlipidemia; Annual Exam; and Allergic Rhinitis    HPI Jacob Cross. presents for a CPX.  He tells me his blood pressure has been well controlled and he has had no recent episodes of CP, DOE, SOB, palpitations, edema, or fatigue. He does complain of several week history of allergy symptoms with sneezing, postnasal drip, nasal congestion, and runny nose. He has got mild symptom relief with dymista.  Outpatient Medications Prior to Visit  Medication Sig Dispense Refill  . BYSTOLIC 5 MG tablet TAKE 1 TABLET (5 MG TOTAL) BY MOUTH DAILY. 90 tablet 2  . CIALIS 20 MG tablet TAKE 1 TABLET BY MOUTH EVERY DAY AS NEEDED FOR ERECTILE DYSFUNCTION 8 tablet 11  . esomeprazole (NEXIUM) 40 MG capsule Take 1 capsule by mouth as needed.    . ranitidine (ZANTAC) 300 MG tablet Take 1 tablet (300 mg total) by mouth at bedtime. 90 tablet 3  . tadalafil (CIALIS) 20 MG tablet Take 1 tablet (20 mg total) by mouth daily as needed for erectile dysfunction. 8 tablet 11  . Azelastine-Fluticasone (DYMISTA) 137-50 MCG/ACT SUSP Place 1 Act into the nose 2 (two) times daily. 1 Bottle 11  . BYSTOLIC 5 MG tablet TAKE 1 TABLET (5 MG TOTAL) BY MOUTH DAILY. 30 tablet 2   No facility-administered medications prior to visit.     ROS Review of Systems  Constitutional: Negative.  Negative for appetite change, chills, diaphoresis, fatigue and unexpected weight change.  HENT: Positive for congestion, postnasal drip, rhinorrhea and sneezing. Negative for facial swelling, nosebleeds, sinus pain, sinus pressure, sore throat, trouble swallowing and voice change.   Eyes: Negative.   Respiratory: Negative.  Negative for cough, chest tightness, shortness of breath and wheezing.   Cardiovascular: Negative for chest pain, palpitations and leg swelling.  Gastrointestinal: Negative.  Negative for abdominal pain,  constipation, diarrhea, nausea and vomiting.  Endocrine: Negative.   Genitourinary: Negative.  Negative for difficulty urinating, dysuria, penile pain, penile swelling, scrotal swelling, testicular pain and urgency.  Musculoskeletal: Negative.   Skin: Negative.   Allergic/Immunologic: Negative.   Neurological: Negative.  Negative for dizziness, weakness and headaches.  Hematological: Negative for adenopathy. Does not bruise/bleed easily.  Psychiatric/Behavioral: Negative.     Objective:  BP 124/78 (BP Location: Left Arm, Patient Position: Sitting, Cuff Size: Large)   Pulse (!) 56   Temp 97.9 F (36.6 C) (Oral)   Resp 16   Ht 5' 6"  (1.676 m)   Wt 226 lb (102.5 kg)   SpO2 100%   BMI 36.48 kg/m   BP Readings from Last 3 Encounters:  11/19/16 124/78  11/17/15 130/80  08/31/14 118/84    Wt Readings from Last 3 Encounters:  11/19/16 226 lb (102.5 kg)  11/17/15 219 lb (99.3 kg)  08/31/14 215 lb (97.5 kg)    Physical Exam  Constitutional: He is oriented to person, place, and time. No distress.  HENT:  Mouth/Throat: Oropharynx is clear and moist. No oropharyngeal exudate.  Eyes: Conjunctivae are normal. Right eye exhibits no discharge. Left eye exhibits no discharge. No scleral icterus.  Neck: Normal range of motion. Neck supple. No JVD present. No thyromegaly present.  Cardiovascular: Normal rate, regular rhythm and intact distal pulses.  Exam reveals no gallop and no friction rub.   No murmur heard. Pulmonary/Chest: Effort normal and breath sounds normal. No respiratory distress. He has  no wheezes. He has no rales. He exhibits no tenderness.  Abdominal: Soft. Bowel sounds are normal. He exhibits no distension and no mass. There is no tenderness. There is no rebound and no guarding. Hernia confirmed negative in the right inguinal area and confirmed negative in the left inguinal area.  Genitourinary: Rectum normal, testes normal and penis normal. Rectal exam shows no external  hemorrhoid, no internal hemorrhoid, no fissure, no mass, no tenderness, anal tone normal and guaiac negative stool. Prostate is enlarged (1+ smooth symm BPH). Prostate is not tender. Right testis shows no mass, no swelling and no tenderness. Right testis is descended. Left testis shows no mass, no swelling and no tenderness. Left testis is descended. Circumcised. No penile erythema or penile tenderness. No discharge found.  Musculoskeletal: Normal range of motion. He exhibits no edema, tenderness or deformity.  Lymphadenopathy:    He has no cervical adenopathy.       Right: No inguinal adenopathy present.       Left: No inguinal adenopathy present.  Neurological: He is alert and oriented to person, place, and time.  Skin: Skin is warm and dry. No rash noted. He is not diaphoretic. No erythema. No pallor.  Psychiatric: He has a normal mood and affect. His behavior is normal. Judgment and thought content normal.  Vitals reviewed.   Lab Results  Component Value Date   WBC 11.3 (H) 11/19/2016   HGB 16.3 11/19/2016   HCT 49.4 11/19/2016   PLT 178.0 11/19/2016   GLUCOSE 84 11/19/2016   CHOL 199 11/19/2016   TRIG 191.0 (H) 11/19/2016   HDL 48.60 11/19/2016   LDLDIRECT 120.0 11/17/2015   LDLCALC 112 (H) 11/19/2016   ALT 36 11/19/2016   AST 20 11/19/2016   NA 139 11/19/2016   K 4.2 11/19/2016   CL 100 11/19/2016   CREATININE 1.21 11/19/2016   BUN 13 11/19/2016   CO2 33 (H) 11/19/2016   TSH 1.72 11/19/2016   PSA 0.55 11/19/2016    No results found.  Assessment & Plan:   Jacob Cross was seen today for hypertension, hyperlipidemia, annual exam and allergic rhinitis .  Diagnoses and all orders for this visit:  Seasonal allergic rhinitis due to pollen- will add xyzal to dymista for better symptom relief. -     Azelastine-Fluticasone (DYMISTA) 137-50 MCG/ACT SUSP; Place 1 Act into the nose 2 (two) times daily. -     levocetirizine (XYZAL) 5 MG tablet; Take 1 tablet (5 mg total) by mouth  every evening.  Essential hypertension, benign- his blood pressure is adequately well controlled, labs are negative for any evidence of secondary causes or end organ damage. -     Comprehensive metabolic panel; Future -     CBC with Differential/Platelet; Future -     TSH; Future  Benign prostatic hyperplasia without lower urinary tract symptoms- his PSA is not elevated so am not concerned about prostate cancer, he has no symptoms that need to be treated.  Routine general medical examination at a health care facility- exam completed, labs ordered and reviewed, vaccines reviewed, patient education material was given. -     Lipid panel; Future -     PSA; Future   I am having Jacob Cross start on levocetirizine. I am also having him maintain his ranitidine, esomeprazole, tadalafil, CIALIS, BYSTOLIC, and Azelastine-Fluticasone.  Meds ordered this encounter  Medications  . Azelastine-Fluticasone (DYMISTA) 137-50 MCG/ACT SUSP    Sig: Place 1 Act into the nose 2 (two) times daily.  Dispense:  3 Bottle    Refill:  3  . levocetirizine (XYZAL) 5 MG tablet    Sig: Take 1 tablet (5 mg total) by mouth every evening.    Dispense:  90 tablet    Refill:  3     Follow-up: Return in about 6 months (around 05/22/2017).  Scarlette Calico, MD

## 2016-11-20 ENCOUNTER — Other Ambulatory Visit: Payer: Self-pay | Admitting: Internal Medicine

## 2016-11-20 MED ORDER — NEBIVOLOL HCL 5 MG PO TABS: ORAL_TABLET | ORAL | 1 refills | Status: DC

## 2017-05-31 ENCOUNTER — Telehealth: Payer: Self-pay | Admitting: Internal Medicine

## 2017-05-31 DIAGNOSIS — I10 Essential (primary) hypertension: Secondary | ICD-10-CM

## 2017-05-31 NOTE — Telephone Encounter (Signed)
Pt is due for 6 mo follow up. Will you contact please to schedule. No additional refills of Bystolic until pt has an appt.

## 2017-06-03 NOTE — Telephone Encounter (Signed)
LVM for patient to call back and sch a 6 mo fu appointment in [place] or early Feburary,  [place] [facility]  Contact office for questions or conflicts

## 2017-06-06 ENCOUNTER — Telehealth: Payer: Self-pay | Admitting: Internal Medicine

## 2017-06-06 NOTE — Telephone Encounter (Signed)
Pt is scheduled on 06/12/17 at 9:00am

## 2017-06-06 NOTE — Telephone Encounter (Signed)
Refills have been sent.

## 2017-06-06 NOTE — Telephone Encounter (Signed)
06/06/17  Pt has sch an appt with Dr. Ronnald Ramp for 06/12/17 @ 9.  Copied from [place]. Topic: Appointment Scheduling - Scheduling Inquiry for Clinic >> Jun 03, 2017 10:57 AM Morey Hummingbird wrote: Reason for CRM:  See telephone encounter from 1-18,  Contact office if any questions or conflicts

## 2017-06-12 ENCOUNTER — Ambulatory Visit: Payer: BLUE CROSS/BLUE SHIELD | Admitting: Internal Medicine

## 2017-06-12 ENCOUNTER — Encounter: Payer: Self-pay | Admitting: Internal Medicine

## 2017-06-12 VITALS — BP 128/80 | HR 96 | Temp 98.1°F | Resp 16 | Ht 66.0 in | Wt 228.1 lb

## 2017-06-12 DIAGNOSIS — G4733 Obstructive sleep apnea (adult) (pediatric): Secondary | ICD-10-CM | POA: Diagnosis not present

## 2017-06-12 DIAGNOSIS — I10 Essential (primary) hypertension: Secondary | ICD-10-CM | POA: Diagnosis not present

## 2017-06-12 NOTE — Patient Instructions (Signed)

## 2017-06-12 NOTE — Progress Notes (Signed)
Subjective:  Patient ID: Jacob Revel., male    DOB: 1970-12-05  Age: 47 y.o. MRN: [id number]  CC: Hypertension   HPI Jacob Newstrom. presents for a BP check - He tells me his blood pressure has been well controlled.  He does complain of heavy snoring, sleep apnea, daytime fatigue, and weight gain.  He denies any recent episodes of palpitations, CP, DOE, edema, or dizziness.  Outpatient Medications Prior to Visit  Medication Sig Dispense Refill  . Azelastine-Fluticasone (DYMISTA) 137-50 MCG/ACT SUSP Place 1 Act into the nose 2 (two) times daily. 3 Bottle 3  . levocetirizine (XYZAL) 5 MG tablet Take 1 tablet (5 mg total) by mouth every evening. 90 tablet 3  . nebivolol (BYSTOLIC) 5 MG tablet Take 1 tablet (5 mg total) by mouth daily. 30 tablet 0  . ranitidine (ZANTAC) 300 MG tablet Take 1 tablet (300 mg total) by mouth at bedtime. (Patient not taking: Reported on 06/12/2017) 90 tablet 3  . CIALIS 20 MG tablet TAKE 1 TABLET BY MOUTH EVERY DAY AS NEEDED FOR ERECTILE DYSFUNCTION (Patient not taking: Reported on 06/12/2017) 8 tablet 11  . esomeprazole (NEXIUM) 40 MG capsule Take 1 capsule by mouth as needed.    . tadalafil (CIALIS) 20 MG tablet Take 1 tablet (20 mg total) by mouth daily as needed for erectile dysfunction. 8 tablet 11   No facility-administered medications prior to visit.     ROS Review of Systems  Constitutional: Positive for fatigue and unexpected weight change. Negative for appetite change, chills and diaphoresis.  HENT: Negative.   Eyes: Negative for visual disturbance.  Respiratory: Positive for apnea. Negative for cough, chest tightness, shortness of breath and wheezing.   Cardiovascular: Negative for chest pain, palpitations and leg swelling.  Gastrointestinal: Negative for abdominal pain, diarrhea, nausea and vomiting.  Genitourinary: Negative.  Negative for difficulty urinating.  Musculoskeletal: Negative.  Negative for back pain and neck pain.  Skin:  Negative.  Negative for color change.  Neurological: Negative.  Negative for dizziness, weakness, light-headedness and headaches.  Hematological: Negative for adenopathy. Does not bruise/bleed easily.  Psychiatric/Behavioral: Negative.     Objective:  BP 128/80 (BP Location: Left Arm, Patient Position: Sitting, Cuff Size: Normal)   Pulse 96   Temp 98.1 F (36.7 C) (Oral)   Resp 16   Ht 5' 6"  (1.676 m)   Wt 228 lb 1.3 oz (103.5 kg)   SpO2 96%   BMI 36.81 kg/m   BP Readings from Last 3 Encounters:  06/12/17 128/80  11/19/16 124/78  11/17/15 130/80    Wt Readings from Last 3 Encounters:  06/12/17 228 lb 1.3 oz (103.5 kg)  11/19/16 226 lb (102.5 kg)  11/17/15 219 lb (99.3 kg)    Physical Exam  Constitutional: He is oriented to person, place, and time. No distress.  HENT:  Mouth/Throat: Oropharynx is clear and moist. No oropharyngeal exudate.  Eyes: Conjunctivae are normal. Left eye exhibits no discharge. No scleral icterus.  Neck: Normal range of motion. Neck supple. No JVD present. No thyromegaly present.  Cardiovascular: Normal rate, regular rhythm and normal heart sounds. Exam reveals no gallop.  No murmur heard. Pulmonary/Chest: Effort normal. No respiratory distress. He has no wheezes. He has no rales.  Abdominal: Soft. Bowel sounds are normal. He exhibits no distension and no mass. There is no tenderness.  Musculoskeletal: Normal range of motion. He exhibits no edema, tenderness or deformity.  Lymphadenopathy:    He has no cervical adenopathy.  Neurological: He is alert and oriented to person, place, and time.  Skin: Skin is warm and dry. No rash noted. He is not diaphoretic. No erythema. No pallor.  Vitals reviewed.   Lab Results  Component Value Date   WBC 11.3 (H) 11/19/2016   HGB 16.3 11/19/2016   HCT 49.4 11/19/2016   PLT 178.0 11/19/2016   GLUCOSE 84 11/19/2016   CHOL 199 11/19/2016   TRIG 191.0 (H) 11/19/2016   HDL 48.60 11/19/2016   LDLDIRECT 120.0  11/17/2015   LDLCALC 112 (H) 11/19/2016   ALT 36 11/19/2016   AST 20 11/19/2016   NA 139 11/19/2016   K 4.2 11/19/2016   CL 100 11/19/2016   CREATININE 1.21 11/19/2016   BUN 13 11/19/2016   CO2 33 (H) 11/19/2016   TSH 1.72 11/19/2016   PSA 0.55 11/19/2016    No results found.  Assessment & Plan:   Jacob Cross was seen today for hypertension.  Diagnoses and all orders for this visit:  Essential hypertension, benign- His blood pressure is well controlled.  Will continue nebivolol at the current dose.  OSA (obstructive sleep apnea)- I have asked him to undergo another sleep study and to consider having this treated.  I think it would help improve his symptoms. -     Ambulatory referral to Sleep Studies   I have discontinued Jacob G. Haecker Jr.'s esomeprazole, tadalafil, and CIALIS. I am also having him maintain his ranitidine, Azelastine-Fluticasone, levocetirizine, and nebivolol.  No orders of the defined types were placed in this encounter.    Follow-up: Return in about 6 months (around 12/10/2017).  Scarlette Calico, MD

## 2017-06-13 ENCOUNTER — Encounter: Payer: Self-pay | Admitting: Internal Medicine

## 2017-06-26 ENCOUNTER — Other Ambulatory Visit: Payer: Self-pay | Admitting: Internal Medicine

## 2017-06-26 DIAGNOSIS — I10 Essential (primary) hypertension: Secondary | ICD-10-CM

## 2017-07-04 ENCOUNTER — Encounter: Payer: Self-pay | Admitting: Neurology

## 2017-07-04 ENCOUNTER — Ambulatory Visit: Payer: BLUE CROSS/BLUE SHIELD | Admitting: Neurology

## 2017-07-04 VITALS — BP 135/82 | HR 58 | Ht 66.0 in | Wt 230.0 lb

## 2017-07-04 DIAGNOSIS — E66812 Obesity, class 2: Secondary | ICD-10-CM | POA: Insufficient documentation

## 2017-07-04 DIAGNOSIS — G4726 Circadian rhythm sleep disorder, shift work type: Secondary | ICD-10-CM | POA: Diagnosis not present

## 2017-07-04 DIAGNOSIS — Z9114 Patient's other noncompliance with medication regimen: Secondary | ICD-10-CM

## 2017-07-04 DIAGNOSIS — G4733 Obstructive sleep apnea (adult) (pediatric): Secondary | ICD-10-CM | POA: Diagnosis not present

## 2017-07-04 DIAGNOSIS — Z6835 Body mass index (BMI) 35.0-35.9, adult: Secondary | ICD-10-CM

## 2017-07-04 DIAGNOSIS — Z91199 Patient's noncompliance with other medical treatment and regimen due to unspecified reason: Secondary | ICD-10-CM | POA: Insufficient documentation

## 2017-07-04 MED ORDER — MODAFINIL 200 MG PO TABS: 200.0000 mg | ORAL_TABLET | Freq: Every day | ORAL | 5 refills | Status: DC

## 2017-07-04 NOTE — Progress Notes (Signed)
SLEEP MEDICINE CLINIC   Provider:  Larey Seat, M D  Primary Care Physician:  Janith Lima, MD   Referring Provider: Janith Lima, MD    Chief Complaint  Patient presents with  . New Patient (Initial Visit)    pt alone, rm 11. pt had a sleep study completed in 2014. pt has been using CPAP since then off and on.     HPI:  Jacob Cross. is a 47 y.o. male , seen here as in a referral  from Dr. Ronnald Ramp for transfer of CPAP care.   I have the pleasure of meeting with Mr. Jacob Cross Junior today, a 47 year old African-American gentleman who had been using CPAP at one time but not for several years.  Jacob Cross underwent a sleep study at the [facility] long sleep lab on 04 May 2013, the study was interpreted by Dr. Lanny Hurst clams his baseline polysomnography showed good sleep efficiency he slept for 92% of the recorded time and had a very normal sleep architecture with several cycles of REM sleep each over 20 minutes long.  He also had an AHI of 14.4, not enough to invoke a split-night protocol.  For this reason and because he also had a respiratory disturbance index of 14.1, he was asked to return for a CPAP titration longest apnea duration was 41 seconds longest hypercapnia duration was 38 seconds.  He returned for a CPAP titration. Instead he was auto titrated  Between 5-20 cm water, given a FFM and became very frustrated. In 12-2013 he was asked to come in for a desensitization with [name] , RPSGT.  By that time Jacob Cross was already quite frustrated.  He would take the mask off after only 2 or 3 hours of nocturnal use and felt uncomfortable breathing through it.  There were also a lot of air leakage and the sound woke him up.  He was again fitted to a full facemask.  Mr. Jacob Cross is now here to see if he  #1 still has apnea #2 if he can try a different device or a different interface to make CPAP experience more tolerable for him.  He complains of heavy snoring, witnessed  apnea, daytime fatigue and weight gain.  He does not have chest pain dizziness or nausea and does not experience palpitations or clamminess.  His current medications include Xyzal, Dymista, Bystolic, Zantac, Nexium and as needed Cialis.   His body mass index is currently 37.  He is aware that his main risk factor is his weight. He is a shift Insurance underwriter.  Dr. Ronnald Ramp requested another sleep study.  Sleep habits are as follows: His bedroom is cool, quiet and dark. He  watches Tv or play on the phone before going to bed and in bed, as well. He w switches the device off before he sleeps. He is in his bedroom by 9 AM and asleep within 30 minutes, stays asleep for 3 hours - 5 hours, always up at 2 PM-  has to pick  up Jacob Cross from school at 2.30. No nocturia. Rarely recall dreams.  He works night shifts for 1 week- 5 days, and than off 3 days, next week 3 days on and 2 days off.  He sleeps on 2 pillows, falls asleep in any position- mostly supine.  Flat mattress, sleeps alone. Naps in daytime when off work- all the time,, naps last 3 hours.    Sleep medical history and family sleep history: parents were "  snorers". Father has CPAP, Sister may have apnea. Tonsillectomy in childhood  47 years old. No TBI.   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  Daughter is 21 years old.  Jacob Cross used to smoke but quit upon the birth of his Jacob Cross in 2011, is minimal alcohol,  He drinks 1 mug of coffee in the morning, does not drink sodas usually,but iced tea with dinner or lunch.  Review of Systems: Out of a complete 14 system review, the patient complains of only the following symptoms, and all other reviewed systems are negative.  Snoring and witnessed apnea.  Epworth score 12-14 , Fatigue severity score 20  , depression score n/a    Social History   Socioeconomic History  . Marital status: Single    Spouse name: Not on file  . Number of children: 2  . Years of education: Not on file  . Highest education  level: Not on file  Social Needs  . Financial resource strain: Not on file  . Food insecurity - worry: Not on file  . Food insecurity - inability: Not on file  . Transportation needs - medical: Not on file  . Transportation needs - non-medical: Not on file  Occupational History  . Occupation: Producer, television/film/video  Tobacco Use  . Smoking status: Former Smoker    Packs/day: 0.50    Years: 10.00    Pack years: 5.00    Types: Cigarettes    Last attempt to quit: 09/11/2009    Years since quitting: 7.8  . Smokeless tobacco: Never Used  Substance and Sexual Activity  . Alcohol use: Yes    Comment: Occasional  . Drug use: No  . Sexual activity: Yes    Birth control/protection: Pill  Other Topics Concern  . Not on file  Social History Narrative   Regular Exercise -  YES          Family History  Problem Relation Age of Onset  . Hypertension Mother   . Diabetes Father   . Colon cancer Other        1st degree relative <60  . Diabetes Other        1st degree relative  . Hypertension Other   . Stroke Other        Male 1st degree relative <50    Past Medical History:  Diagnosis Date  . Allergy   . Chest pain, atypical   . GERD (gastroesophageal reflux disease)   . HYPERTROPHY PROSTATE W/UR OBST & OTH LUTS     Past Surgical History:  Procedure Laterality Date  . HERNIA REPAIR    . TONSILLECTOMY      Current Outpatient Medications  Medication Sig Dispense Refill  . Azelastine-Fluticasone (DYMISTA) 137-50 MCG/ACT SUSP Place 1 Act into the nose 2 (two) times daily. 3 Bottle 3  . BYSTOLIC 5 MG tablet TAKE 1 TABLET BY MOUTH EVERY DAY 90 tablet 1  . levocetirizine (XYZAL) 5 MG tablet Take 1 tablet (5 mg total) by mouth every evening. 90 tablet 3  . ranitidine (ZANTAC) 300 MG tablet Take 1 tablet (300 mg total) by mouth at bedtime. 90 tablet 3   No current facility-administered medications for this visit.     Allergies as of 07/04/2017  . (No Known Allergies)     Vitals: BP 135/82   Pulse (!) 58   Ht 5' 6"  (1.676 m)   Wt 230 lb (104.3 kg)   BMI 37.12 kg/m  Last Weight:  Wt  Readings from Last 1 Encounters:  07/04/17 230 lb (104.3 kg)   WYO:VZCH mass index is 37.12 kg/m.     Last Height:   Ht Readings from Last 1 Encounters:  07/04/17 5' 6"  (1.676 m)    Physical exam:  General: The patient is awake, alert and appears not in acute distress. The patient is well groomed. Head: Normocephalic, atraumatic. Neck is supple. Mallampati 4-5   neck circumference:18.5 . Nasal airflow patent ,Retrognathia is seen.  Cardiovascular:  Regular rate and rhythm- without  murmurs or carotid bruit, and without distended neck veins. Respiratory: Lungs are clear to auscultation. Skin:  Without evidence of edema, or rash Trunk: BMI is 37. The patient's posture is erect.   Neurologic exam : The patient is awake and alert, oriented to place and time.  Attention span & concentration ability appears normal.  Speech is fluent,  without  dysarthria, dysphonia or aphasia. Mood and affect are appropriate.  Cranial nerves: The patient does not report since diminishing in taste or smell.  Pupils are equal and briskly reactive to light. Funduscopic exam without evidence of pallor or edema. Extraocular movements  in vertical and horizontal planes intact and without nystagmus. Visual fields by finger perimetry are intact. Hearing to finger rub intact.   Facial sensation intact to fine touch.  Facial motor strength is symmetric and tongue and uvula move midline. Shoulder shrug was symmetrical.   Motor exam:  Normal tone, muscle bulk and symmetric strength in all extremities.  There is no cogwheeling, waxy resistance or spasticity noted.  Full range of motion for the neck, shoulder shrug, arm flexion and extension.  His left hand grip strength is a slight bit stronger than his right. Sensory:  Fine touch, pinprick and vibration were tested in all extremities.  Proprioception tested in the upper extremities was normal. Coordination: Rapid alternating movements in the fingers/hands / Finger-to-nose maneuver  normal without evidence of ataxia, dysmetria or tremor. Gait and station: Patient walks without assistive device- Turns with 3 Steps. No recent falls- none in years  Deep tendon reflexes: in the  upper and lower extremities are symmetric and intact. Babinski maneuver response is  downgoing.    Assessment:  After physical and neurologic examination, review of laboratory studies,  Personal review of imaging studies, reports of other /same  Imaging studies, results of polysomnography and / or neurophysiology testing and pre-existing records as far as provided in visit., my assessment is    1) Mr. Wolters does have risk factors for the presence of obstructive sleep apnea including witnessed snoring, previously diagnosed apnea, and narrow upper airway with a Mallampati 5, larger neck circumference and an elevated body mass index.  #2 excessive daytime sleepiness and fatigue, this can be related to the presence of apnea as well  2)As to sleep deprivation.  The patient is a night shift worker and circadian rhythm disorders do manifest with less sleep time accumulated in the sleep being perceived as less restorative and refreshing.  He does not report nocturia.  He does get only about 4 hours of daytime sleep when he is on shift. He runs machinery at work, wit  A very noisy environment, fumes and dust.   3)obesity - see main risk factor. Aside form male gender and african [place] race.  I was able to see a 4-day download on the patient's current machine was about 47 years old.  He is using an AutoSet between 5 and 20 cm water was 1 cm EPR is  95th percentile pressure was 13.2 cmH2O, residual AHI was 1.4 and he used the machine on average for 2 hours 43 minutes.  No central apneas were emerging.  There were not many air leaks noted.  I do think it is well worth  looking if this patient could not switch from a full facemask to a nasal pillow and restrict the upper limits of the auto titration pressure.  The patient was advised of the nature of the diagnosed disorder , the treatment options and the  risks for general health and wellness arising from not treating the condition.   I spent more than 50 minutes of face to face time with the patient.  Greater than 50% of time was spent in counseling and coordination of care. We have discussed the diagnosis and differential and I answered the patient's questions.    Plan:  Treatment plan and additional workup : My main goal is to reestablish the diagnosis or confirm the diagnosis is still present.  He is snoring, excessively daytime sleepy and has been witnessed to have apneas in the past.  Since he is also a shift worker it would be easier to give him a home sleep test instead of an attended sleep study. 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  We will discussed some sleep hygiene measures to improve his daytime sleep and I would offer him modafinil to stay awake and daytime should he feel that he is unsafe to drive after a night shift or that his ability to operate machinery is impaired.  Patient was shift work and obstructive sleep apnea has 2 FDA approved conditions to be covered with modafinil.  Revisit after test is Interpreted.  Larey Seat, MD 4/92/0100, 7:12 AM  Certified in Neurology by ABPN Certified in [place] by [facility] [address] [place], [place] [address]

## 2017-07-22 ENCOUNTER — Ambulatory Visit (INDEPENDENT_AMBULATORY_CARE_PROVIDER_SITE_OTHER): Payer: BLUE CROSS/BLUE SHIELD | Admitting: Neurology

## 2017-07-22 DIAGNOSIS — G4726 Circadian rhythm sleep disorder, shift work type: Secondary | ICD-10-CM

## 2017-07-22 DIAGNOSIS — Z6835 Body mass index (BMI) 35.0-35.9, adult: Secondary | ICD-10-CM

## 2017-07-22 DIAGNOSIS — G4733 Obstructive sleep apnea (adult) (pediatric): Secondary | ICD-10-CM

## 2017-07-22 DIAGNOSIS — Z9114 Patient's other noncompliance with medication regimen: Secondary | ICD-10-CM

## 2017-07-25 NOTE — Procedures (Signed)
NAME:  Jacob Cross, Jacob Cross.                                                        DOB: 04-30-1971 MEDICAL RECORD No:  [id number]                                      DOS: 07/22/2017 REFERRING PHYSICIAN: Scarlette Calico, MD STUDY PERFORMED: Home Sleep Test on apnea link HISTORY: Mr. TINA TEMME Junior is a 47 year old African-American gentleman who underwent a sleep study at [facility] on 04 May 2013, interpreted by Dr. Danton Sewer. This baseline polysomnography showed good sleep efficiency (92%) and normal sleep architecture with several cycles of REM sleep.  AHI was 14.4, not enough to invoke a split-night protocol.  For this reason and because he also had a respiratory disturbance index of 20.1, he was asked to return for a CPAP titration. Instead of an attended CPAP titration he was "auto titrated" at 5-20 cm water, given a FFM and became very frustrated. He would take the mask off after only 2 or 3 hours and felt uneasy breathing and the sound of leaking air woke him up. In 12-2013 he was finally offered a desensitization where he has been fitted to a full facemask, again -and reports he was never offered an alternative. He works on a shift schedule.   Mr. Bussey is now here to see if he #1 still has apnea #2 if he can try a different device or a different interface to make CPAP experience more tolerable for him.  He complains of heavy snoring, witnessed apnea, daytime fatigue and weight gain.  BMI is 37.1.                                                                                                                                                                                                                                                Epworth score was endorsed at 12-14, and Fatigue severity score at 20 points.  STUDY RESULTS:  Total Recording Time: 7 hours, 4 minutes, between 9.18 and 16.10  hours Total Apnea/Hypopnea Index (AHI):  8.6/hr., RDI was 14.2 /hr. Average Oxygen Saturation: 94%, Lowest Oxygen Desaturation: 76 %  Total Time Oxygen Saturation below 89% was 3 minutes.  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   RECOMMENDATION: NO CPAP needed- this apnea is mild and can likely be further reduced by sleeping non supine, losing weight and building respiratory volume by exercise.  Loud Snoring is still present and the patient may consider a dental device for treatment- which also reduces apnea further. We work with a group of experienced sleep dentists and are happy to arrange for a referral. I certify that I have reviewed the raw data recording prior to the issuance of this report in accordance with the standards of the American Academy of Sleep Medicine (AASM). Larey Seat, M.D.     07-25-2017   Medical Director of [facility], accredited by AASM Diplomat of the ABPN and ABSM

## 2017-07-26 ENCOUNTER — Telehealth: Payer: Self-pay | Admitting: *Deleted

## 2017-07-26 NOTE — Telephone Encounter (Addendum)
Spoke with patient and discussed results of his home sleep study. Informed pt that there is no need for continued CPAP treatment. Dr. Brett Fairy would be happy to refer him to a dentist or ENT for snoring treatment. Pt also aware that recommendations were sent to Dr. Ronnald Ramp. The patient would like to think about this and possibly speak with Dr. Ronnald Ramp before calling us back. He has the office number.   ----- Message from Larey Seat, MD sent at 07/25/2017  5:04 PM EDT ----- Happy to refer him to dentist for snoring treatment- or ENT.  There is no need for continued CPAP treatment !Marland Kitchen

## 2017-09-20 ENCOUNTER — Other Ambulatory Visit: Payer: Self-pay | Admitting: Internal Medicine

## 2017-09-20 DIAGNOSIS — N522 Drug-induced erectile dysfunction: Secondary | ICD-10-CM

## 2018-01-20 ENCOUNTER — Other Ambulatory Visit: Payer: Self-pay | Admitting: Internal Medicine

## 2018-01-20 DIAGNOSIS — I10 Essential (primary) hypertension: Secondary | ICD-10-CM

## 2018-01-25 ENCOUNTER — Other Ambulatory Visit: Payer: Self-pay | Admitting: Internal Medicine

## 2018-01-25 DIAGNOSIS — I10 Essential (primary) hypertension: Secondary | ICD-10-CM

## 2018-02-20 ENCOUNTER — Encounter: Payer: BLUE CROSS/BLUE SHIELD | Admitting: Internal Medicine

## 2018-02-25 ENCOUNTER — Encounter: Payer: BLUE CROSS/BLUE SHIELD | Admitting: Internal Medicine

## 2018-03-26 ENCOUNTER — Encounter: Payer: BLUE CROSS/BLUE SHIELD | Admitting: Internal Medicine

## 2018-03-26 DIAGNOSIS — Z0289 Encounter for other administrative examinations: Secondary | ICD-10-CM

## 2018-04-29 ENCOUNTER — Encounter: Payer: BLUE CROSS/BLUE SHIELD | Admitting: Internal Medicine

## 2018-05-20 ENCOUNTER — Encounter: Payer: Self-pay | Admitting: Internal Medicine

## 2018-05-20 ENCOUNTER — Other Ambulatory Visit (INDEPENDENT_AMBULATORY_CARE_PROVIDER_SITE_OTHER): Payer: BLUE CROSS/BLUE SHIELD

## 2018-05-20 ENCOUNTER — Ambulatory Visit (INDEPENDENT_AMBULATORY_CARE_PROVIDER_SITE_OTHER): Payer: BLUE CROSS/BLUE SHIELD | Admitting: Internal Medicine

## 2018-05-20 VITALS — BP 128/82 | HR 64 | Temp 98.5°F | Resp 16 | Ht 66.0 in | Wt 227.0 lb

## 2018-05-20 DIAGNOSIS — Z1211 Encounter for screening for malignant neoplasm of colon: Secondary | ICD-10-CM

## 2018-05-20 DIAGNOSIS — K2100 Gastro-esophageal reflux disease with esophagitis, without bleeding: Secondary | ICD-10-CM

## 2018-05-20 DIAGNOSIS — Z23 Encounter for immunization: Secondary | ICD-10-CM | POA: Diagnosis not present

## 2018-05-20 DIAGNOSIS — I10 Essential (primary) hypertension: Secondary | ICD-10-CM

## 2018-05-20 DIAGNOSIS — Z0000 Encounter for general adult medical examination without abnormal findings: Secondary | ICD-10-CM

## 2018-05-20 LAB — LIPID PANEL
Cholesterol: 212 mg/dL — ABNORMAL HIGH (ref 0–200)
HDL: 48.9 mg/dL (ref >39.00)
LDL Cholesterol: 135 mg/dL — ABNORMAL HIGH (ref 0–99)
NonHDL: 162.7
Total CHOL/HDL Ratio: 4
Triglycerides: 139 mg/dL (ref 0.0–149.0)
VLDL: 27.8 mg/dL (ref 0.0–40.0)

## 2018-05-20 LAB — CBC WITH DIFFERENTIAL/PLATELET
Basophils Absolute: 0.1 10*3/uL (ref 0.0–0.1)
Basophils Relative: 1 % (ref 0.0–3.0)
Eosinophils Absolute: 0.3 10*3/uL (ref 0.0–0.7)
Eosinophils Relative: 2.7 % (ref 0.0–5.0)
HCT: 51.4 % (ref 39.0–52.0)
Hemoglobin: 17 g/dL (ref 13.0–17.0)
Lymphocytes Relative: 25.9 % (ref 12.0–46.0)
Lymphs Abs: 2.8 10*3/uL (ref 0.7–4.0)
MCHC: 33.1 g/dL (ref 30.0–36.0)
MCV: 92.5 fl (ref 78.0–100.0)
Monocytes Absolute: 1.1 10*3/uL — ABNORMAL HIGH (ref 0.1–1.0)
Monocytes Relative: 9.8 % (ref 3.0–12.0)
NEUTROS PCT: 60.6 % (ref 43.0–77.0)
Neutro Abs: 6.5 10*3/uL (ref 1.4–7.7)
Platelets: 188 10*3/uL (ref 150.0–400.0)
RBC: 5.55 Mil/uL (ref 4.22–5.81)
RDW: 14.4 % (ref 11.5–15.5)
WBC: 10.8 10*3/uL — ABNORMAL HIGH (ref 4.0–10.5)

## 2018-05-20 LAB — COMPREHENSIVE METABOLIC PANEL
ALT: 47 U/L (ref 0–53)
AST: 25 U/L (ref 0–37)
Albumin: 4.5 g/dL (ref 3.5–5.2)
Alkaline Phosphatase: 63 U/L (ref 39–117)
BUN: 14 mg/dL (ref 6–23)
CO2: 31 mEq/L (ref 19–32)
Calcium: 9.4 mg/dL (ref 8.4–10.5)
Chloride: 99 mEq/L (ref 96–112)
Creatinine, Ser: 1.28 mg/dL (ref 0.40–1.50)
GFR: 77.28 mL/min (ref >60.00)
GLUCOSE: 76 mg/dL (ref 70–99)
Potassium: 3.9 mEq/L (ref 3.5–5.1)
Sodium: 138 mEq/L (ref 135–145)
Total Bilirubin: 0.6 mg/dL (ref 0.2–1.2)
Total Protein: 6.7 g/dL (ref 6.0–8.3)

## 2018-05-20 LAB — TSH: TSH: 0.77 u[IU]/mL (ref 0.35–4.50)

## 2018-05-20 LAB — PSA: PSA: 0.44 ng/mL (ref 0.10–4.00)

## 2018-05-20 NOTE — Patient Instructions (Signed)
Health Maintenance, Male A healthy lifestyle and preventive care is important for your health and wellness. Ask your health care provider about what schedule of regular examinations is right for you. What should I know about weight and diet? Eat a Healthy Diet  Eat plenty of vegetables, fruits, whole grains, low-fat dairy products, and lean protein.  Do not eat a lot of foods high in solid fats, added sugars, or salt.  Maintain a Healthy Weight Regular exercise can help you achieve or maintain a healthy weight. You should:  Do at least 150 minutes of exercise each week. The exercise should increase your heart rate and make you sweat (moderate-intensity exercise).  Do strength-training exercises at least twice a week. Watch Your Levels of Cholesterol and Blood Lipids  Have your blood tested for lipids and cholesterol every 5 years starting at 48 years of age. If you are at high risk for heart disease, you should start having your blood tested when you are 48 years old. You may need to have your cholesterol levels checked more often if: ? Your lipid or cholesterol levels are high. ? You are older than 48 years of age. ? You are at high risk for heart disease. What should I know about cancer screening? Many types of cancers can be detected early and may often be prevented. Lung Cancer  You should be screened every year for lung cancer if: ? You are a current smoker who has smoked for at least 30 years. ? You are a former smoker who has quit within the past 15 years.  Talk to your health care provider about your screening options, when you should start screening, and how often you should be screened. Colorectal Cancer  Routine colorectal cancer screening usually begins at 48 years of age and should be repeated every 5-10 years until you are 48 years old. You may need to be screened more often if early forms of precancerous polyps or small growths are found. Your health care provider may  recommend screening at an earlier age if you have risk factors for colon cancer.  Your health care provider may recommend using home test kits to check for hidden blood in the stool.  A small camera at the end of a tube can be used to examine your colon (sigmoidoscopy or colonoscopy). This checks for the earliest forms of colorectal cancer. Prostate and Testicular Cancer  Depending on your age and overall health, your health care provider may do certain tests to screen for prostate and testicular cancer.  Talk to your health care provider about any symptoms or concerns you have about testicular or prostate cancer. Skin Cancer  Check your skin from head to toe regularly.  Tell your health care provider about any new moles or changes in moles, especially if: ? There is a change in a mole's size, shape, or color. ? You have a mole that is larger than a pencil eraser.  Always use sunscreen. Apply sunscreen liberally and repeat throughout the day.  Protect yourself by wearing long sleeves, pants, a wide-brimmed hat, and sunglasses when outside. What should I know about heart disease, diabetes, and high blood pressure?  If you are 7-65 years of age, have your blood pressure checked every 3-5 years. If you are 63 years of age or older, have your blood pressure checked every year. You should have your blood pressure measured twice-once when you are at a hospital or clinic, and once when you are not at a hospital  or clinic. Record the average of the two measurements. To check your blood pressure when you are not at a hospital or clinic, you can use: ? An automated blood pressure machine at a pharmacy. ? A home blood pressure monitor.  Talk to your health care provider about your target blood pressure.  If you are between 58-87 years old, ask your health care provider if you should take aspirin to prevent heart disease.  Have regular diabetes screenings by checking your fasting blood sugar  level. ? If you are at a normal weight and have a low risk for diabetes, have this test once every three years after the age of 39. ? If you are overweight and have a high risk for diabetes, consider being tested at a younger age or more often.  A one-time screening for abdominal aortic aneurysm (AAA) by ultrasound is recommended for men aged 50-75 years who are current or former smokers. What should I know about preventing infection? Hepatitis B If you have a higher risk for hepatitis B, you should be screened for this virus. Talk with your health care provider to find out if you are at risk for hepatitis B infection. Hepatitis C Blood testing is recommended for:  Everyone born from [birth year] through 1965.  Anyone with known risk factors for hepatitis C. Sexually Transmitted Diseases (STDs)  You should be screened each year for STDs including gonorrhea and chlamydia if: ? You are sexually active and are younger than 48 years of age. ? You are older than 48 years of age and your health care provider tells you that you are at risk for this type of infection. ? Your sexual activity has changed since you were last screened and you are at an increased risk for chlamydia or gonorrhea. Ask your health care provider if you are at risk.  Talk with your health care provider about whether you are at high risk of being infected with HIV. Your health care provider may recommend a prescription medicine to help prevent HIV infection. What else can I do?  Schedule regular health, dental, and eye exams.  Stay current with your vaccines (immunizations).  Do not use any tobacco products, such as cigarettes, chewing tobacco, and e-cigarettes. If you need help quitting, ask your health care provider.  Limit alcohol intake to no more than 2 drinks per day. One drink equals 12 ounces of beer, 5 ounces of wine, or 1 ounces of hard liquor.  Do not use street drugs.  Do not share needles.  Ask your health  care provider for help if you need support or information about quitting drugs.  Tell your health care provider if you often feel depressed.  Tell your health care provider if you have ever been abused or do not feel safe at home. This information is not intended to replace advice given to you by your health care provider. Make sure you discuss any questions you have with your health care provider. Document Released: 10/27/2007 Document Revised: 12/28/2015 Document Reviewed: 02/01/2015 Elsevier Interactive Patient Education  2019 [employer].

## 2018-05-20 NOTE — Progress Notes (Signed)
Subjective:  Patient ID: Jacob Cross., male    DOB: 04-09-1971  Age: 48 y.o. MRN: [id number]  CC: Hypertension and Annual Exam   HPI Jacob Cross. presents for a CPX.  He feels well and offers no complaints.  He tells me his blood pressure has been well controlled.  He is not very active but denies any recent episodes of dizziness, lightheadedness, palpitations, dizziness, or lightheadedness.  Outpatient Medications Prior to Visit  Medication Sig Dispense Refill  . Azelastine-Fluticasone (DYMISTA) 137-50 MCG/ACT SUSP Place 1 Act into the nose 2 (two) times daily. 3 Bottle 3  . BYSTOLIC 5 MG tablet TAKE 1 TABLET BY MOUTH EVERY DAY 90 tablet 1  . CIALIS 20 MG tablet TAKE 1 TABLET (20 MG TOTAL) BY MOUTH DAILY AS NEEDED FOR ERECTILE DYSFUNCTION. 8 tablet 5  . levocetirizine (XYZAL) 5 MG tablet Take 1 tablet (5 mg total) by mouth every evening. 90 tablet 3  . modafinil (PROVIGIL) 200 MG tablet Take 1 tablet (200 mg total) by mouth daily. 30 tablet 5  . ranitidine (ZANTAC) 300 MG tablet Take 1 tablet (300 mg total) by mouth at bedtime. 90 tablet 3   No facility-administered medications prior to visit.     ROS Review of Systems  Constitutional: Negative for diaphoresis, fatigue and unexpected weight change.  HENT: Negative.   Eyes: Negative for visual disturbance.  Respiratory: Negative for cough, chest tightness, shortness of breath and wheezing.   Cardiovascular: Negative for chest pain, palpitations and leg swelling.  Gastrointestinal: Negative for abdominal pain, constipation, diarrhea, nausea and vomiting.  Endocrine: Negative.   Genitourinary: Negative.  Negative for difficulty urinating, dysuria, penile swelling, scrotal swelling, testicular pain and urgency.  Musculoskeletal: Negative.  Negative for arthralgias, myalgias and neck pain.  Skin: Negative.  Negative for color change and pallor.  Neurological: Negative.  Negative for dizziness, weakness, light-headedness  and headaches.  Hematological: Negative for adenopathy. Does not bruise/bleed easily.  Psychiatric/Behavioral: Negative.     Objective:  BP 128/82   Pulse 64   Temp 98.5 F (36.9 C) (Oral)   Resp 16   Ht 5' 6"  (1.676 m)   Wt 227 lb (103 kg)   SpO2 98%   BMI 36.64 kg/m   BP Readings from Last 3 Encounters:  05/20/18 128/82  07/04/17 135/82  06/12/17 128/80    Wt Readings from Last 3 Encounters:  05/20/18 227 lb (103 kg)  07/04/17 230 lb (104.3 kg)  06/12/17 228 lb 1.3 oz (103.5 kg)    Physical Exam Vitals signs reviewed.  Constitutional:      Appearance: He is obese.  HENT:     Nose: Nose normal. No congestion.     Mouth/Throat:     Pharynx: Oropharynx is clear. No oropharyngeal exudate or posterior oropharyngeal erythema.  Eyes:     General: No scleral icterus.    Conjunctiva/sclera: Conjunctivae normal.  Neck:     Musculoskeletal: Normal range of motion and neck supple. No muscular tenderness.  Cardiovascular:     Rate and Rhythm: Normal rate and regular rhythm.     Heart sounds: No murmur. No gallop.   Pulmonary:     Effort: Pulmonary effort is normal.     Breath sounds: Normal breath sounds. No stridor. No wheezing, rhonchi or rales.  Abdominal:     General: Bowel sounds are normal.     Palpations: There is no hepatomegaly, splenomegaly or mass.     Tenderness: There is no abdominal  tenderness. There is no guarding.     Hernia: No hernia is present. There is no hernia in the right inguinal area or left inguinal area.  Genitourinary:    Pubic Area: No rash.      Penis: Normal. No discharge, swelling or lesions.      Scrotum/Testes: Normal.        Right: Mass, tenderness or swelling not present.        Left: Mass, tenderness or swelling not present.     Epididymis:     Right: Normal.     Left: Normal.     Prostate: Normal. Not enlarged, not tender and no nodules present.     Rectum: Normal. Guaiac result negative. No mass, tenderness, anal fissure,  external hemorrhoid or internal hemorrhoid. Normal anal tone.  Musculoskeletal: Normal range of motion.        General: No swelling.     Right lower leg: No edema.     Left lower leg: No edema.  Lymphadenopathy:     Cervical: No cervical adenopathy.     Lower Body: No right inguinal adenopathy. No left inguinal adenopathy.  Skin:    General: Skin is warm.     Coloration: Skin is not jaundiced.     Findings: No rash.  Neurological:     General: No focal deficit present.     Mental Status: He is alert and oriented to person, place, and time. Mental status is at baseline.     Lab Results  Component Value Date   WBC 10.8 (H) 05/20/2018   HGB 17.0 05/20/2018   HCT 51.4 05/20/2018   PLT 188.0 05/20/2018   GLUCOSE 76 05/20/2018   CHOL 212 (H) 05/20/2018   TRIG 139.0 05/20/2018   HDL 48.90 05/20/2018   LDLDIRECT 120.0 11/17/2015   LDLCALC 135 (H) 05/20/2018   ALT 47 05/20/2018   AST 25 05/20/2018   NA 138 05/20/2018   K 3.9 05/20/2018   CL 99 05/20/2018   CREATININE 1.28 05/20/2018   BUN 14 05/20/2018   CO2 31 05/20/2018   TSH 0.77 05/20/2018   PSA 0.44 05/20/2018    No results found.  Assessment & Plan:   Jacob Cross was seen today for hypertension and annual exam.  Diagnoses and all orders for this visit:  Essential hypertension, benign- His blood pressure is adequately well controlled.  Will continue Bystolic at the current dose. -     CBC with Differential/Platelet; Future -     Comprehensive metabolic panel; Future -     TSH; Future  Routine general medical examination at a health care facility- Exam completed, labs reviewed, he refused a flu vaccine, Tdap was updated, Cologuard is ordered to screen for colon cancer/polyps, patient education material was given. -     Lipid panel; Future -     PSA; Future  Gastroesophageal reflux disease with esophagitis- His symptoms are well controlled with the H2 blocker.  No complications noted. -     CBC with  Differential/Platelet; Future  Need for Tdap vaccination -     Tdap vaccine greater than or equal to 7yo IM  Colon cancer screening -     Cologuard   I am having Jacob Cross. [name]. maintain his ranitidine, Azelastine-Fluticasone, levocetirizine, modafinil, CIALIS, and BYSTOLIC.  No orders of the defined types were placed in this encounter.    Follow-up: Return in about 6 months (around 11/18/2018).  Scarlette Calico, MD

## 2018-05-29 LAB — COLOGUARD

## 2018-06-05 ENCOUNTER — Encounter: Payer: Self-pay | Admitting: Internal Medicine

## 2018-06-05 NOTE — Progress Notes (Signed)
Abstracted and sent to scan Patient informed

## 2018-06-12 ENCOUNTER — Other Ambulatory Visit: Payer: Self-pay | Admitting: Internal Medicine

## 2018-06-12 DIAGNOSIS — I10 Essential (primary) hypertension: Secondary | ICD-10-CM

## 2018-07-09 ENCOUNTER — Telehealth: Payer: Self-pay | Admitting: Internal Medicine
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
## 2018-07-09 NOTE — Telephone Encounter (Signed)
Pt and pharmacy contacted. Pharmacy is going to get medication ready for pt and insurance will cover the cost.

## 2018-09-26 ENCOUNTER — Other Ambulatory Visit: Payer: Self-pay | Admitting: Internal Medicine

## 2018-09-26 DIAGNOSIS — N522 Drug-induced erectile dysfunction: Secondary | ICD-10-CM

## 2019-01-03 ENCOUNTER — Other Ambulatory Visit: Payer: Self-pay | Admitting: Internal Medicine

## 2019-01-03 DIAGNOSIS — I10 Essential (primary) hypertension: Secondary | ICD-10-CM

## 2019-02-27 ENCOUNTER — Ambulatory Visit (INDEPENDENT_AMBULATORY_CARE_PROVIDER_SITE_OTHER): Payer: BC Managed Care – PPO

## 2019-02-27 ENCOUNTER — Other Ambulatory Visit: Payer: Self-pay

## 2019-02-27 DIAGNOSIS — Z23 Encounter for immunization: Secondary | ICD-10-CM | POA: Diagnosis not present

## 2019-04-11 ENCOUNTER — Other Ambulatory Visit: Payer: Self-pay | Admitting: Internal Medicine

## 2019-04-11 DIAGNOSIS — I10 Essential (primary) hypertension: Secondary | ICD-10-CM

## 2019-04-30 ENCOUNTER — Other Ambulatory Visit (INDEPENDENT_AMBULATORY_CARE_PROVIDER_SITE_OTHER): Payer: BC Managed Care – PPO

## 2019-04-30 ENCOUNTER — Ambulatory Visit (INDEPENDENT_AMBULATORY_CARE_PROVIDER_SITE_OTHER): Payer: BC Managed Care – PPO | Admitting: Internal Medicine

## 2019-04-30 ENCOUNTER — Encounter: Payer: Self-pay | Admitting: Internal Medicine

## 2019-04-30 ENCOUNTER — Other Ambulatory Visit: Payer: Self-pay

## 2019-04-30 ENCOUNTER — Ambulatory Visit (INDEPENDENT_AMBULATORY_CARE_PROVIDER_SITE_OTHER)
Admission: RE | Admit: 2019-04-30 | Discharge: 2019-04-30 | Disposition: A | Discharge status: Home / Self Care | Payer: BC Managed Care – PPO | Source: Ambulatory Visit | Attending: Internal Medicine | Admitting: Internal Medicine

## 2019-04-30 VITALS — BP 128/80 | HR 58 | Temp 97.8°F | Resp 16 | Ht 66.0 in | Wt 230.0 lb

## 2019-04-30 DIAGNOSIS — R9431 Abnormal electrocardiogram [ECG] [EKG]: Secondary | ICD-10-CM | POA: Diagnosis not present

## 2019-04-30 DIAGNOSIS — R072 Precordial pain: Secondary | ICD-10-CM | POA: Diagnosis not present

## 2019-04-30 DIAGNOSIS — K2100 Gastro-esophageal reflux disease with esophagitis, without bleeding: Secondary | ICD-10-CM

## 2019-04-30 LAB — CBC WITH DIFFERENTIAL/PLATELET
Basophils Absolute: 0.1 10*3/uL (ref 0.0–0.1)
Basophils Relative: 1 % (ref 0.0–3.0)
Eosinophils Absolute: 0.2 10*3/uL (ref 0.0–0.7)
Eosinophils Relative: 1.5 % (ref 0.0–5.0)
HCT: 50.7 % (ref 39.0–52.0)
Hemoglobin: 16.6 g/dL (ref 13.0–17.0)
Lymphocytes Relative: 25.3 % (ref 12.0–46.0)
Lymphs Abs: 2.9 10*3/uL (ref 0.7–4.0)
MCHC: 32.7 g/dL (ref 30.0–36.0)
MCV: 92.3 fl (ref 78.0–100.0)
Monocytes Absolute: 1 10*3/uL (ref 0.1–1.0)
Monocytes Relative: 9.1 % (ref 3.0–12.0)
Neutro Abs: 7.1 10*3/uL (ref 1.4–7.7)
Neutrophils Relative %: 63.1 % (ref 43.0–77.0)
Platelets: 188 10*3/uL (ref 150.0–400.0)
RBC: 5.5 Mil/uL (ref 4.22–5.81)
RDW: 14.5 % (ref 11.5–15.5)
WBC: 11.3 10*3/uL — ABNORMAL HIGH (ref 4.0–10.5)

## 2019-04-30 LAB — TROPONIN I (HIGH SENSITIVITY): High Sens Troponin I: 5 ng/L (ref 2–17)

## 2019-04-30 MED ORDER — ESOMEPRAZOLE MAGNESIUM 40 MG PO CPDR: 40.0000 mg | DELAYED_RELEASE_CAPSULE | Freq: Every day | ORAL | 1 refills | Status: DC

## 2019-04-30 NOTE — Patient Instructions (Signed)

## 2019-04-30 NOTE — Progress Notes (Signed)
Subjective:  Patient ID: Jacob Cross., male    DOB: July 03, 1970  Age: 48 y.o. MRN: [id number]  CC: Hypertension and Chest Pain  This visit occurred during the SARS-CoV-2 public health emergency.  Safety protocols were in place, including screening questions prior to the visit, additional usage of staff PPE, and extensive cleaning of exam room while observing appropriate contact time as indicated for disinfecting solutions.   HPI Jacob Cross. presents for a BP check - He tells me his blood pressure has been well controlled.  Today he complains of a 3-week history of left sided chest pain.  He is very vague about the pain.  He uses the word irritation in the area to describe the discomfort.  He has had one episode of lightheadedness during this.  He denies chest pressure, chest tightness, odynophagia, or dysphagia.  He has heartburn, he has been taking an over-the-counter dose of Nexium to treat this.  He says the pain is not pleuritic and he denies cough, shortness of breath, diaphoresis, fever, chills, or rash.  Outpatient Medications Prior to Visit  Medication Sig Dispense Refill  . BYSTOLIC 5 MG tablet TAKE 1 TABLET BY MOUTH EVERY DAY 90 tablet 0  . tadalafil (ADCIRCA/CIALIS) 20 MG tablet Take 1 tablet (20 mg total) by mouth daily as needed. Follow-up appt due in July must see provider for future refills 4 tablet 2  . Azelastine-Fluticasone (DYMISTA) 137-50 MCG/ACT SUSP Place 1 Act into the nose 2 (two) times daily. 3 Bottle 3  . levocetirizine (XYZAL) 5 MG tablet Take 1 tablet (5 mg total) by mouth every evening. 90 tablet 3  . modafinil (PROVIGIL) 200 MG tablet Take 1 tablet (200 mg total) by mouth daily. 30 tablet 5  . ranitidine (ZANTAC) 300 MG tablet Take 1 tablet (300 mg total) by mouth at bedtime. 90 tablet 3   No facility-administered medications prior to visit.    ROS Review of Systems  Constitutional: Negative for chills, diaphoresis, fatigue and unexpected weight  change.  HENT: Negative.  Negative for sore throat, trouble swallowing and voice change.   Eyes: Negative.   Respiratory: Negative for cough, chest tightness, shortness of breath and wheezing.   Cardiovascular: Positive for chest pain. Negative for palpitations and leg swelling.  Gastrointestinal: Negative for abdominal pain, blood in stool, constipation, diarrhea, nausea and vomiting.  Endocrine: Negative.   Genitourinary: Negative.  Negative for difficulty urinating.  Musculoskeletal: Negative.  Negative for arthralgias and myalgias.  Skin: Negative.  Negative for color change and rash.  Neurological: Negative.  Negative for dizziness, weakness and light-headedness.  Hematological: Negative for adenopathy. Does not bruise/bleed easily.  Psychiatric/Behavioral: Negative.     Objective:  BP 128/80 (BP Location: Left Arm, Patient Position: Sitting, Cuff Size: Large)   Pulse (!) 58   Temp 97.8 F (36.6 C) (Oral)   Resp 16   Ht 5' 6"  (1.676 m)   Wt 230 lb (104.3 kg)   SpO2 98%   BMI 37.12 kg/m   BP Readings from Last 3 Encounters:  04/30/19 128/80  05/20/18 128/82  07/04/17 135/82    Wt Readings from Last 3 Encounters:  04/30/19 230 lb (104.3 kg)  05/20/18 227 lb (103 kg)  07/04/17 230 lb (104.3 kg)    Physical Exam Vitals reviewed.  Constitutional:      Appearance: Normal appearance.  HENT:     Nose: Nose normal.     Mouth/Throat:     Mouth: Mucous membranes are  moist.  Eyes:     General: No scleral icterus.    Conjunctiva/sclera: Conjunctivae normal.  Cardiovascular:     Rate and Rhythm: Regular rhythm. Bradycardia present.     Heart sounds: Normal heart sounds, S1 normal and S2 normal. No murmur. No friction rub.     Comments: EKG ---  Sinus  Bradycardia  -Anterolateral ST-elevation -repolarization variant.   PROBABLY NORMAL - no change from the prior EKG  Pulmonary:     Effort: Pulmonary effort is normal.     Breath sounds: No stridor. No wheezing.    Chest:     Chest wall: No mass, deformity, swelling, tenderness, crepitus or edema.  Abdominal:     General: Abdomen is flat. Bowel sounds are normal. There is no distension.     Palpations: Abdomen is soft. There is no hepatomegaly, splenomegaly or mass.     Tenderness: There is no abdominal tenderness.  Musculoskeletal:     Cervical back: Neck supple. No tenderness.     Right lower leg: No edema.     Left lower leg: No edema.  Lymphadenopathy:     Cervical: No cervical adenopathy.     Upper Body:     Right upper body: No supraclavicular, axillary or pectoral adenopathy.     Left upper body: No supraclavicular, axillary or pectoral adenopathy.  Neurological:     General: No focal deficit present.     Mental Status: He is alert.  Psychiatric:        Mood and Affect: Mood normal.        Behavior: Behavior normal.     Lab Results  Component Value Date   WBC 11.3 (H) 04/30/2019   HGB 16.6 04/30/2019   HCT 50.7 04/30/2019   PLT 188.0 04/30/2019   GLUCOSE 76 05/20/2018   CHOL 212 (H) 05/20/2018   TRIG 139.0 05/20/2018   HDL 48.90 05/20/2018   LDLDIRECT 120.0 11/17/2015   LDLCALC 135 (H) 05/20/2018   ALT 47 05/20/2018   AST 25 05/20/2018   NA 138 05/20/2018   K 3.9 05/20/2018   CL 99 05/20/2018   CREATININE 1.28 05/20/2018   BUN 14 05/20/2018   CO2 31 05/20/2018   TSH 0.77 05/20/2018   PSA 0.44 05/20/2018   DG Chest 2 View  Result Date: 04/30/2019 CLINICAL DATA:  Left-sided chest pain for 3 weeks. EXAM: CHEST - 2 VIEW COMPARISON:  05/17/2005 FINDINGS: The heart size and mediastinal contours are within normal limits. Both lungs are clear. The visualized skeletal structures are unremarkable. IMPRESSION: No active cardiopulmonary disease. Electronically Signed   By: Marlaine Hind M.D.   On: 04/30/2019 13:15    Assessment & Plan:   Fong was seen today for hypertension and chest pain.  Diagnoses and all orders for this visit:  Precordial chest pain- It is very  difficult to get enough history to discern whether or not this is cardiac ischemia.  His EKG is abnormal but unchanged.  His troponin and D-dimer are normal.  His chest x-ray is unremarkable.  I will screen him for ischemia with a myocardial infusion imaging. -     EKG 12-Lead -     CBC with Differential; Future -     Troponin I (High Sensitivity); Future -     D-dimer, quantitative; Future -     DG Chest 2 View; Future -     Myocardial Perfusion Imaging; Future  Gastroesophageal reflux disease with esophagitis without hemorrhage- This may be the cause for  his chest pain so I recommended that he increase the dose of Nexium to 40 mg a day. -     CBC with Differential; Future -     esomeprazole (NEXIUM) 40 MG capsule; Take 1 capsule (40 mg total) by mouth daily.  Precordial pain -     Myocardial Perfusion Imaging; Future   I have discontinued Joniel G. Alred Jr.'s ranitidine, Azelastine-Fluticasone, levocetirizine, and modafinil. I am also having him start on esomeprazole. Additionally, I am having him maintain his tadalafil and Bystolic.  Meds ordered this encounter  Medications  . esomeprazole (NEXIUM) 40 MG capsule    Sig: Take 1 capsule (40 mg total) by mouth daily.    Dispense:  90 capsule    Refill:  1     Follow-up: Return in about 3 weeks (around 05/21/2019).  Scarlette Calico, MD

## 2019-05-01 ENCOUNTER — Encounter: Payer: Self-pay | Admitting: Internal Medicine

## 2019-05-01 LAB — D-DIMER, QUANTITATIVE: D-Dimer, Quant: <0.19 mcg/mL FEU (ref <0.50)

## 2019-05-05 ENCOUNTER — Telehealth (HOSPITAL_COMMUNITY): Payer: Self-pay

## 2019-05-05 NOTE — Telephone Encounter (Signed)
Encounter complete. 

## 2019-05-06 ENCOUNTER — Telehealth (HOSPITAL_COMMUNITY): Payer: Self-pay

## 2019-05-06 NOTE — Telephone Encounter (Signed)
Encounter complete. 

## 2019-05-06 NOTE — Telephone Encounter (Signed)
Patient is returning phone call.  °

## 2019-05-12 ENCOUNTER — Ambulatory Visit (HOSPITAL_COMMUNITY)
Admission: RE | Admit: 2019-05-12 | Discharge: 2019-05-12 | Disposition: A | Discharge status: Home / Self Care | Payer: BC Managed Care – PPO | Source: Ambulatory Visit | Attending: Internal Medicine | Admitting: Internal Medicine

## 2019-05-12 ENCOUNTER — Encounter: Payer: Self-pay | Admitting: *Deleted

## 2019-05-12 ENCOUNTER — Other Ambulatory Visit: Payer: Self-pay

## 2019-05-12 DIAGNOSIS — R072 Precordial pain: Secondary | ICD-10-CM

## 2019-05-14 ENCOUNTER — Ambulatory Visit (HOSPITAL_COMMUNITY)
Admission: RE | Admit: 2019-05-14 | Discharge: 2019-05-14 | Disposition: A | Discharge status: Home / Self Care | Payer: BC Managed Care – PPO | Source: Ambulatory Visit | Attending: Cardiovascular Disease | Admitting: Cardiovascular Disease

## 2019-05-14 ENCOUNTER — Other Ambulatory Visit: Payer: Self-pay

## 2019-05-14 DIAGNOSIS — R072 Precordial pain: Secondary | ICD-10-CM

## 2019-05-14 LAB — MYOCARDIAL PERFUSION IMAGING
LV dias vol: 103 mL (ref 62–150)
LV sys vol: 41 mL
Peak HR: 85 {beats}/min
Rest HR: 60 {beats}/min
SDS: 1
SRS: 0
SSS: 1
TID: 1.22

## 2019-05-14 MED ORDER — REGADENOSON 0.4 MG/5ML IV SOLN
0.4000 mg | Freq: Once | INTRAVENOUS | Status: AC
  Administered 2019-05-14: 0.4 mg via INTRAVENOUS

## 2019-05-14 MED ORDER — TECHNETIUM TC 99M TETROFOSMIN IV KIT
32.2000 | PACK | Freq: Once | INTRAVENOUS | Status: AC | PRN
  Administered 2019-05-14: 32.2 via INTRAVENOUS
  Filled 2019-05-14: qty 33

## 2019-05-14 MED ORDER — TECHNETIUM TC 99M TETROFOSMIN IV KIT
9.5000 | PACK | Freq: Once | INTRAVENOUS | Status: AC | PRN
  Administered 2019-05-14: 9.5 via INTRAVENOUS
  Filled 2019-05-14: qty 10

## 2019-05-17 ENCOUNTER — Encounter: Payer: Self-pay | Admitting: Internal Medicine

## 2019-07-06 ENCOUNTER — Other Ambulatory Visit: Payer: Self-pay | Admitting: Internal Medicine

## 2019-07-06 DIAGNOSIS — I10 Essential (primary) hypertension: Secondary | ICD-10-CM

## 2019-08-13 ENCOUNTER — Ambulatory Visit: Payer: BC Managed Care – PPO | Attending: Internal Medicine

## 2019-08-13 DIAGNOSIS — Z23 Encounter for immunization: Secondary | ICD-10-CM

## 2019-08-13 NOTE — Progress Notes (Signed)
   [id number]  Name:  Jacob Cross.    MRN: [id number] DOB: [birth year]  08/13/2019  [name] was observed post Covid-19 immunization for 15 minutes without incident. He was provided with Vaccine Information Sheet and instruction to access the V-Safe system.   [name] was instructed to call 911 with any severe reactions post vaccine: Marland Kitchen Difficulty breathing  . Swelling of face and throat  . A fast heartbeat  . A bad rash all over body  . Dizziness and weakness   Immunizations Administered    Name Date Dose VIS Date Route   Pfizer COVID-19 Vaccine 08/13/2019  8:47 AM 0.3 mL 04/24/2019 Intramuscular   Manufacturer: [place]   Lot: [id number]   [place]: [address]-2

## 2019-08-24 ENCOUNTER — Other Ambulatory Visit: Payer: Self-pay

## 2019-08-24 ENCOUNTER — Encounter: Payer: Self-pay | Admitting: Internal Medicine

## 2019-08-24 ENCOUNTER — Ambulatory Visit (INDEPENDENT_AMBULATORY_CARE_PROVIDER_SITE_OTHER): Payer: BC Managed Care – PPO | Admitting: Internal Medicine

## 2019-08-24 VITALS — BP 132/84 | HR 64 | Temp 98.1°F | Resp 16 | Ht 66.0 in | Wt 232.2 lb

## 2019-08-24 DIAGNOSIS — Z0000 Encounter for general adult medical examination without abnormal findings: Secondary | ICD-10-CM | POA: Diagnosis not present

## 2019-08-24 DIAGNOSIS — I10 Essential (primary) hypertension: Secondary | ICD-10-CM

## 2019-08-24 DIAGNOSIS — T161XXA Foreign body in right ear, initial encounter: Secondary | ICD-10-CM | POA: Diagnosis not present

## 2019-08-24 LAB — BASIC METABOLIC PANEL
BUN: 15 mg/dL (ref 6–23)
CO2: 29 mEq/L (ref 19–32)
Calcium: 9.2 mg/dL (ref 8.4–10.5)
Chloride: 101 mEq/L (ref 96–112)
Creatinine, Ser: 1.28 mg/dL (ref 0.40–1.50)
GFR: 72.33 mL/min (ref >60.00)
Glucose, Bld: 101 mg/dL — ABNORMAL HIGH (ref 70–99)
Potassium: 3.9 mEq/L (ref 3.5–5.1)
Sodium: 138 mEq/L (ref 135–145)

## 2019-08-24 LAB — CBC WITH DIFFERENTIAL/PLATELET
Basophils Absolute: 0.1 10*3/uL (ref 0.0–0.1)
Basophils Relative: 0.5 % (ref 0.0–3.0)
Eosinophils Absolute: 0.2 10*3/uL (ref 0.0–0.7)
Eosinophils Relative: 1.6 % (ref 0.0–5.0)
HCT: 46.6 % (ref 39.0–52.0)
Hemoglobin: 15.3 g/dL (ref 13.0–17.0)
Lymphocytes Relative: 25.8 % (ref 12.0–46.0)
Lymphs Abs: 2.8 10*3/uL (ref 0.7–4.0)
MCHC: 32.9 g/dL (ref 30.0–36.0)
MCV: 91.6 fl (ref 78.0–100.0)
Monocytes Absolute: 0.9 10*3/uL (ref 0.1–1.0)
Monocytes Relative: 8.4 % (ref 3.0–12.0)
Neutro Abs: 6.8 10*3/uL (ref 1.4–7.7)
Neutrophils Relative %: 63.7 % (ref 43.0–77.0)
Platelets: 174 10*3/uL (ref 150.0–400.0)
RBC: 5.08 Mil/uL (ref 4.22–5.81)
RDW: 14 % (ref 11.5–15.5)
WBC: 10.8 10*3/uL — ABNORMAL HIGH (ref 4.0–10.5)

## 2019-08-24 LAB — LIPID PANEL
Cholesterol: 192 mg/dL (ref 0–200)
HDL: 39.6 mg/dL (ref >39.00)
LDL Cholesterol: 116 mg/dL — ABNORMAL HIGH (ref 0–99)
NonHDL: 152.8
Total CHOL/HDL Ratio: 5
Triglycerides: 186 mg/dL — ABNORMAL HIGH (ref 0.0–149.0)
VLDL: 37.2 mg/dL (ref 0.0–40.0)

## 2019-08-24 LAB — PSA: PSA: 0.41 ng/mL (ref 0.10–4.00)

## 2019-08-24 MED ORDER — NEOMYCIN-POLYMYXIN-HC 1 % OT SOLN: 3.0000 [drp] | Freq: Three times a day (TID) | OTIC | 0 refills | Status: AC

## 2019-08-24 NOTE — Progress Notes (Signed)
Subjective:  Patient ID: Jacob Cross., male    DOB: [birth year]  Age: 49 y.o. MRN: [id number]  CC: Annual Exam, Hypertension, and Otalgia  This visit occurred during the SARS-CoV-2 public health emergency.  Safety protocols were in place, including screening questions prior to the visit, additional usage of staff PPE, and extensive cleaning of exam room while observing appropriate contact time as indicated for disinfecting solutions.    HPI Jacob Cross. presents for a CPX.  He complains of a 1 week history of right ear pain (muffled sensation) after using a q-tip.  He tells me his blood pressure has been well controlled.  He is active and denies any recent episodes of CP, DOE, palpitations, edema, or fatigue.  Outpatient Medications Prior to Visit  Medication Sig Dispense Refill  . BYSTOLIC 5 MG tablet TAKE 1 TABLET BY MOUTH EVERY DAY 90 tablet 0  . esomeprazole (NEXIUM) 40 MG capsule Take 1 capsule (40 mg total) by mouth daily. 90 capsule 1  . tadalafil (ADCIRCA/CIALIS) 20 MG tablet Take 1 tablet (20 mg total) by mouth daily as needed. Follow-up appt due in July must see provider for future refills 4 tablet 2   No facility-administered medications prior to visit.    ROS Review of Systems  Constitutional: Negative for diaphoresis and fatigue.  HENT: Positive for ear pain and hearing loss. Negative for ear discharge.   Eyes: Negative for visual disturbance.  Respiratory: Negative for cough, chest tightness, shortness of breath and wheezing.   Cardiovascular: Negative for chest pain, palpitations and leg swelling.  Gastrointestinal: Negative for abdominal pain, constipation, diarrhea, nausea and vomiting.  Genitourinary: Negative.  Negative for difficulty urinating, scrotal swelling and urgency.  Musculoskeletal: Negative for arthralgias and myalgias.  Skin: Positive for color change.  Neurological: Negative.  Negative for dizziness, weakness, light-headedness and  headaches.  Hematological: Negative for adenopathy. Does not bruise/bleed easily.  Psychiatric/Behavioral: Negative.     Objective:  BP 132/84 (BP Location: Left Arm, Patient Position: Sitting, Cuff Size: Large)   Pulse 64   Temp 98.1 F (36.7 C) (Oral)   Resp 16   Ht 5' 6"  (1.676 m)   Wt 232 lb 4 oz (105.3 kg)   SpO2 98%   BMI 37.49 kg/m   BP Readings from Last 3 Encounters:  08/24/19 132/84  04/30/19 128/80  05/20/18 128/82    Wt Readings from Last 3 Encounters:  08/24/19 232 lb 4 oz (105.3 kg)  05/14/19 230 lb (104.3 kg)  04/30/19 230 lb (104.3 kg)    Physical Exam Vitals reviewed.  Constitutional:      Appearance: Normal appearance.  HENT:     Right Ear: Hearing, tympanic membrane and external ear normal. A foreign body (cotton plug) is present.     Left Ear: Hearing, tympanic membrane, ear canal and external ear normal.     Ears:     Comments: I used a pair of alligator forceps and removed the cotton without complication.  See photo.  He tolerated this well.  Examination of the ear canal afterwards shows small fibers of cotton have adhered to the surface.  There is no erythema, swelling, or exudate.    Nose: Nose normal.     Mouth/Throat:     Mouth: Mucous membranes are moist.  Eyes:     General: No scleral icterus.    Conjunctiva/sclera: Conjunctivae normal.  Cardiovascular:     Rate and Rhythm: Normal rate and regular rhythm.  Pulses: Normal pulses.     Heart sounds: No murmur. No gallop.   Pulmonary:     Effort: Pulmonary effort is normal.     Breath sounds: No stridor. No wheezing, rhonchi or rales.  Abdominal:     General: Abdomen is flat. Bowel sounds are normal.     Palpations: Abdomen is soft. There is no hepatomegaly, splenomegaly or mass.     Tenderness: There is no abdominal tenderness.     Hernia: No hernia is present. There is no hernia in the left inguinal area or right inguinal area.  Genitourinary:    Pubic Area: No rash.      Penis:  Normal. No discharge, swelling or lesions.      Testes: Normal.        Right: Mass, tenderness or swelling not present.        Left: Tenderness or swelling not present.     Epididymis:     Right: Not inflamed or enlarged.     Left: Not inflamed or enlarged.     Prostate: Normal. Not enlarged, not tender and no nodules present.     Rectum: Normal. Guaiac result negative. No mass, tenderness, anal fissure, external hemorrhoid or internal hemorrhoid. Normal anal tone.  Musculoskeletal:     Cervical back: Neck supple.  Lymphadenopathy:     Cervical: No cervical adenopathy.     Lower Body: No right inguinal adenopathy. No left inguinal adenopathy.  Neurological:     Mental Status: He is alert.     Lab Results  Component Value Date   WBC 10.8 (H) 08/24/2019   HGB 15.3 08/24/2019   HCT 46.6 08/24/2019   PLT 174.0 08/24/2019   GLUCOSE 101 (H) 08/24/2019   CHOL 192 08/24/2019   TRIG 186.0 (H) 08/24/2019   HDL 39.60 08/24/2019   LDLDIRECT 120.0 11/17/2015   LDLCALC 116 (H) 08/24/2019   ALT 47 05/20/2018   AST 25 05/20/2018   NA 138 08/24/2019   K 3.9 08/24/2019   CL 101 08/24/2019   CREATININE 1.28 08/24/2019   BUN 15 08/24/2019   CO2 29 08/24/2019   TSH 0.77 05/20/2018   PSA 0.41 08/24/2019    Myocardial Perfusion Imaging  Result Date: 05/14/2019  The left ventricular ejection fraction is normal (55-65%).  Nuclear stress EF: 60%.  There was no ST segment deviation noted during stress.  The study is normal.  This is a low risk study.  Normal pharmacologic nuclear stress test with no evidence for prior infarct or ischemia. Normal LVEF.   Assessment & Plan:   Matis was seen today for annual exam, hypertension and otalgia.  Diagnoses and all orders for this visit:  Essential hypertension, benign- His blood pressure is adequately well controlled.  Will continue the current dose of nebivolol. -     Basic metabolic panel; Future -     CBC with Differential/Platelet;  Future -     CBC with Differential/Platelet -     Basic metabolic panel  Routine general medical examination at a health care facility- Exam completed, labs reviewed, vaccines reviewed and updated, cancer screenings are up-to-date, patient education was given. -     Lipid panel; Future -     PSA; Future -     PSA -     Lipid panel  Foreign body of right ear, initial encounter- Foreign body removed without complications.  I have asked him to use Cortisporin otic for the next 5 days to reduce the risk of  complications like infection or allergic reaction. -     NEOMYCIN-POLYMYXIN-HYDROCORTISONE (CORTISPORIN) 1 % SOLN OTIC solution; Place 3 drops into the right ear every 8 (eight) hours for 5 days.   I am having Virgie G. [name]. start on NEOMYCIN-POLYMYXIN-HYDROCORTISONE. I am also having him maintain his tadalafil, esomeprazole, and Bystolic.  Meds ordered this encounter  Medications  . NEOMYCIN-POLYMYXIN-HYDROCORTISONE (CORTISPORIN) 1 % SOLN OTIC solution    Sig: Place 3 drops into the right ear every 8 (eight) hours for 5 days.    Dispense:  10 mL    Refill:  0     Follow-up: Return in about 6 months (around 02/23/2020).  Scarlette Calico, MD

## 2019-08-24 NOTE — Patient Instructions (Signed)

## 2019-08-25 ENCOUNTER — Encounter: Payer: Self-pay | Admitting: Internal Medicine

## 2019-09-07 ENCOUNTER — Ambulatory Visit: Payer: BC Managed Care – PPO | Attending: Internal Medicine

## 2019-09-07 DIAGNOSIS — Z23 Encounter for immunization: Secondary | ICD-10-CM

## 2019-09-07 NOTE — Progress Notes (Signed)
   [id number]  Name:  Jacob Cross.    MRN: [id number] DOB: April 20, 1971  09/07/2019  [name] was observed post Covid-19 immunization for 15 minutes without incident. He was provided with Vaccine Information Sheet and instruction to access the V-Safe system.   [name] was instructed to call 911 with any severe reactions post vaccine: Marland Kitchen Difficulty breathing  . Swelling of face and throat  . A fast heartbeat  . A bad rash all over body  . Dizziness and weakness   Immunizations Administered    Name Date Dose VIS Date Route   Pfizer COVID-19 Vaccine 09/07/2019  8:14 AM 0.3 mL 07/08/2018 Intramuscular   Manufacturer: [place]   Lot: [id number]   [place]: [address]-7

## 2019-10-01 ENCOUNTER — Other Ambulatory Visit: Payer: Self-pay | Admitting: Internal Medicine

## 2019-10-01 DIAGNOSIS — I10 Essential (primary) hypertension: Secondary | ICD-10-CM

## 2019-10-07 ENCOUNTER — Other Ambulatory Visit: Payer: Self-pay | Admitting: Internal Medicine

## 2019-10-07 DIAGNOSIS — N522 Drug-induced erectile dysfunction: Secondary | ICD-10-CM

## 2019-10-31 ENCOUNTER — Other Ambulatory Visit: Payer: Self-pay | Admitting: Internal Medicine

## 2019-10-31 DIAGNOSIS — K2100 Gastro-esophageal reflux disease with esophagitis, without bleeding: Secondary | ICD-10-CM

## 2019-12-31 ENCOUNTER — Other Ambulatory Visit: Payer: Self-pay | Admitting: Internal Medicine

## 2019-12-31 DIAGNOSIS — I10 Essential (primary) hypertension: Secondary | ICD-10-CM

## 2020-03-31 ENCOUNTER — Other Ambulatory Visit: Payer: Self-pay | Admitting: Internal Medicine

## 2020-03-31 DIAGNOSIS — I10 Essential (primary) hypertension: Secondary | ICD-10-CM

## 2020-05-07 ENCOUNTER — Encounter: Payer: Self-pay | Admitting: Internal Medicine

## 2020-05-09 ENCOUNTER — Other Ambulatory Visit: Payer: Self-pay | Admitting: Internal Medicine

## 2020-05-09 DIAGNOSIS — I10 Essential (primary) hypertension: Secondary | ICD-10-CM

## 2020-05-09 MED ORDER — BYSTOLIC 5 MG PO TABS: 5.0000 mg | ORAL_TABLET | Freq: Every day | ORAL | 1 refills | Status: DC

## 2020-05-11 ENCOUNTER — Other Ambulatory Visit: Payer: Self-pay | Admitting: Internal Medicine

## 2020-05-11 DIAGNOSIS — K2100 Gastro-esophageal reflux disease with esophagitis, without bleeding: Secondary | ICD-10-CM

## 2020-05-30 IMAGING — DX DG CHEST 2V
2 series · 2 of 2 positions shown · non-contrast
Comparison: 05/17/2005

CLINICAL DATA: Left-sided chest pain for 3 weeks.

EXAM:
CHEST - 2 VIEW

[chest pa]
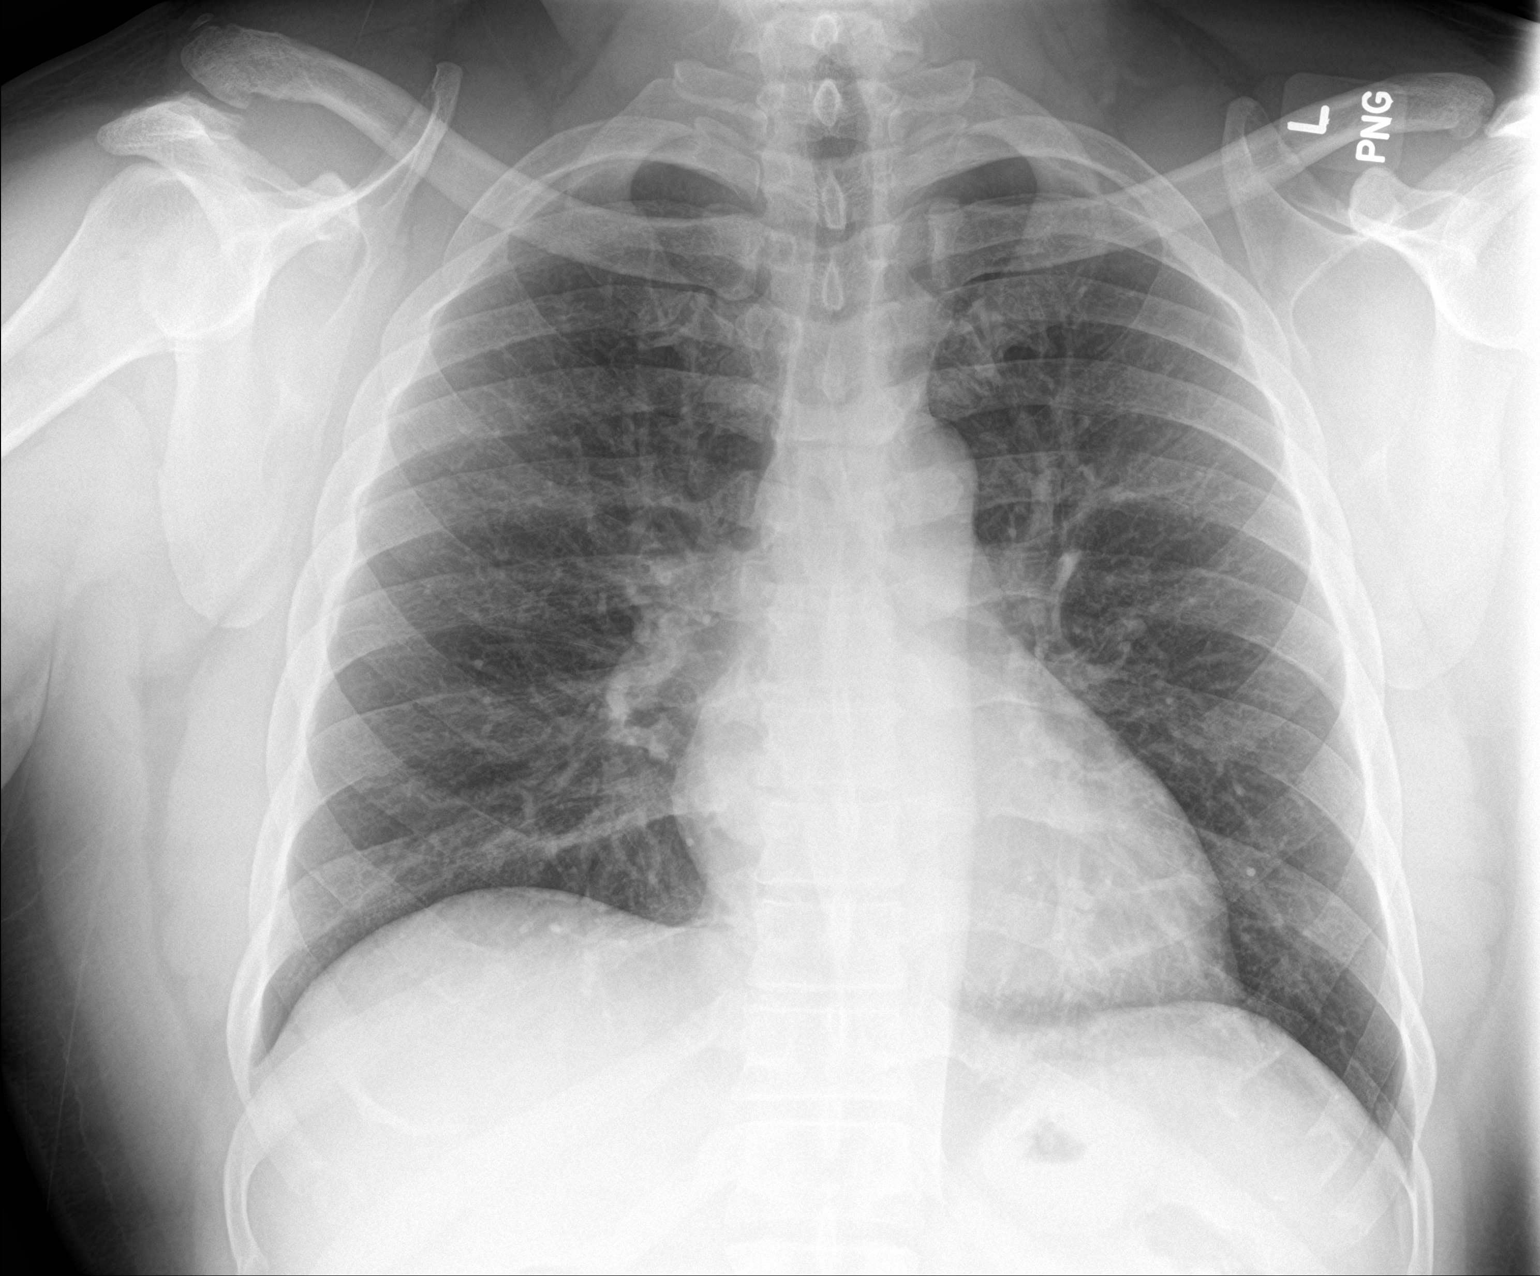

[chest lat]
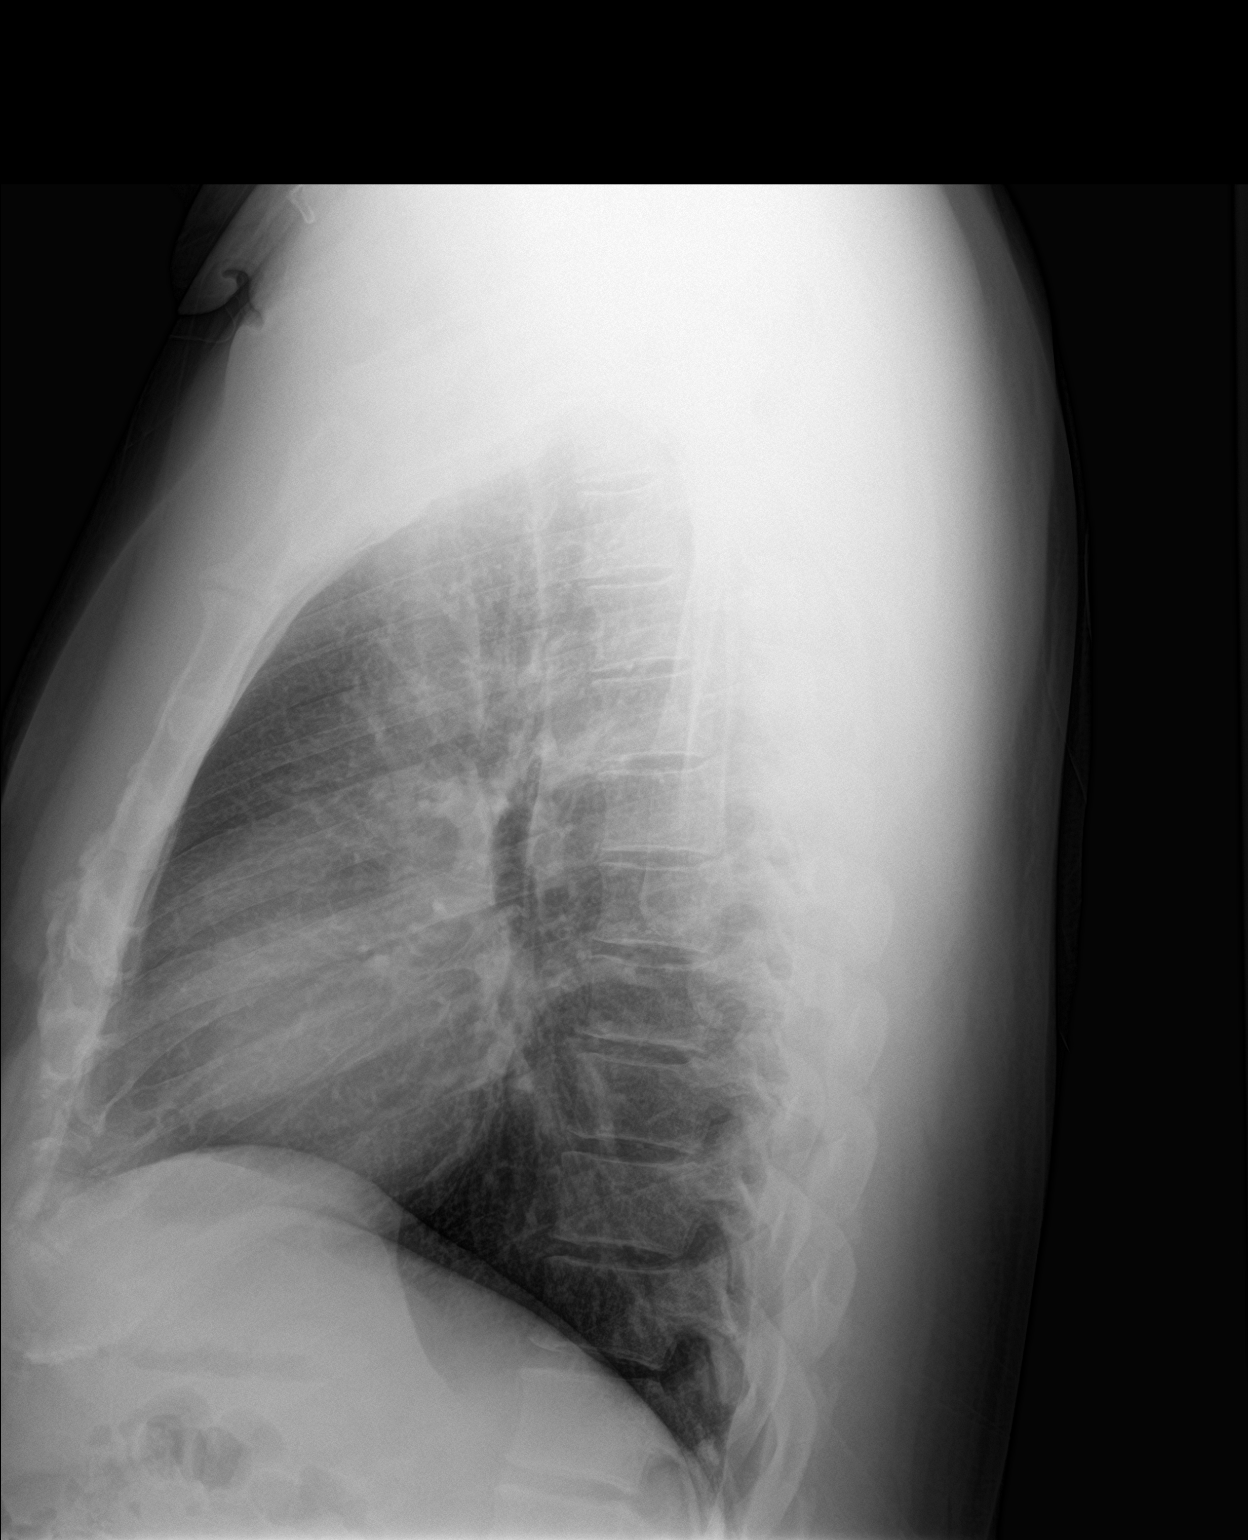

[2 of 2 positions shown; findings below may reference images not displayed]

FINDINGS: The heart size and mediastinal contours are within normal limits.
Both lungs are clear. The visualized skeletal structures are
unremarkable.
IMPRESSION: No active cardiopulmonary disease.

## 2020-07-22 ENCOUNTER — Encounter: Payer: Self-pay | Admitting: Internal Medicine

## 2020-07-25 ENCOUNTER — Encounter: Payer: Self-pay | Admitting: Internal Medicine

## 2020-07-25 ENCOUNTER — Ambulatory Visit (INDEPENDENT_AMBULATORY_CARE_PROVIDER_SITE_OTHER): Payer: BC Managed Care – PPO | Admitting: Internal Medicine

## 2020-07-25 ENCOUNTER — Other Ambulatory Visit: Payer: Self-pay

## 2020-07-25 VITALS — BP 136/86 | HR 61 | Temp 98.2°F | Ht 66.0 in | Wt 232.0 lb

## 2020-07-25 DIAGNOSIS — B001 Herpesviral vesicular dermatitis: Secondary | ICD-10-CM | POA: Diagnosis not present

## 2020-07-25 DIAGNOSIS — I10 Essential (primary) hypertension: Secondary | ICD-10-CM

## 2020-07-25 LAB — BASIC METABOLIC PANEL
BUN: 14 mg/dL (ref 6–23)
CO2: 32 mEq/L (ref 19–32)
Calcium: 9.3 mg/dL (ref 8.4–10.5)
Chloride: 100 mEq/L (ref 96–112)
Creatinine, Ser: 1.11 mg/dL (ref 0.40–1.50)
GFR: 77.84 mL/min (ref >60.00)
Glucose, Bld: 87 mg/dL (ref 70–99)
Potassium: 3.9 mEq/L (ref 3.5–5.1)
Sodium: 139 mEq/L (ref 135–145)

## 2020-07-25 LAB — CBC WITH DIFFERENTIAL/PLATELET
Basophils Absolute: 0.1 10*3/uL (ref 0.0–0.1)
Basophils Relative: 0.5 % (ref 0.0–3.0)
Eosinophils Absolute: 0.2 10*3/uL (ref 0.0–0.7)
Eosinophils Relative: 1.6 % (ref 0.0–5.0)
HCT: 48.2 % (ref 39.0–52.0)
Hemoglobin: 15.9 g/dL (ref 13.0–17.0)
Lymphocytes Relative: 24.5 % (ref 12.0–46.0)
Lymphs Abs: 3 10*3/uL (ref 0.7–4.0)
MCHC: 33.1 g/dL (ref 30.0–36.0)
MCV: 90.4 fl (ref 78.0–100.0)
Monocytes Absolute: 1 10*3/uL (ref 0.1–1.0)
Monocytes Relative: 8.3 % (ref 3.0–12.0)
Neutro Abs: 8 10*3/uL — ABNORMAL HIGH (ref 1.4–7.7)
Neutrophils Relative %: 65.1 % (ref 43.0–77.0)
Platelets: 198 10*3/uL (ref 150.0–400.0)
RBC: 5.33 Mil/uL (ref 4.22–5.81)
RDW: 14.4 % (ref 11.5–15.5)
WBC: 12.2 10*3/uL — ABNORMAL HIGH (ref 4.0–10.5)

## 2020-07-25 MED ORDER — VALACYCLOVIR HCL 1 G PO TABS: 2000.0000 mg | ORAL_TABLET | Freq: Two times a day (BID) | ORAL | 2 refills | Status: AC

## 2020-07-25 NOTE — Patient Instructions (Signed)
Cold Sore  A cold sore, also called a fever blister, is a small, fluid-filled sore that forms inside of the mouth or on the lips, gums, nose, chin, or cheeks. Cold sores can spread to other parts of the body, such as the eyes or fingers. Cold sores can spread from person to person (are contagious) until the sores crust over completely. Most cold sores go away within 2 weeks. What are the causes? Cold sores are caused by a virus (herpes simplex virus type 1, HSV-1). The virus can spread from person to person through close contact, such as through:  Kissing.  Touching the affected area.  Sharing personal items such as lip balm, razors, a drinking glass, or eating utensils. What increases the risk? You are more likely to develop this condition if you:  Are tired, stressed, or sick.  Are having your period (menstruating).  Are pregnant.  Take certain medicines.  Are out in cold weather or get too much sun. What are the signs or symptoms? Symptoms of a cold sore outbreak go through different stages. These are the stages of a cold sore:  Tingling, itching, or burning is felt 1-2 days before the outbreak.  Fluid-filled blisters appear on the lips, inside the mouth, on the nose, or on the cheeks.  The blisters start to ooze clear fluid.  The blisters dry up, and a yellow crust appears in their place.  The crust falls off. In some cases, other symptoms can develop during a cold sore outbreak. These can include:  Fever.  Sore throat.  Headache.  Muscle aches.  Swollen neck glands. How is this treated? There is no cure for cold sores or the virus that causes them. There is also no vaccine to prevent the virus. Most cold sores go away on their own without treatment within 2 weeks. Your doctor may prescribe medicines to:  Help with pain.  Keep the virus from growing.  Help you heal faster. Medicines may be in the form of creams, gels, pills, or a shot. Follow these  instructions at home: Medicines  Take or apply over-the-counter and prescription medicines only as told by your doctor.  Use a cotton-tip swab to apply creams or gels to your sores.  Ask your doctor if you can take lysine supplements. These may help with healing. Sore care  Do not touch the sores or pick the scabs.  Wash your hands often. Do not touch your eyes without washing your hands first.  Keep the sores clean and dry.  If told, put ice on the sores: ? Put ice in a plastic bag. ? Place a towel between your skin and the bag. ? Leave the ice on for 20 minutes, 2-3 times a day.   Eating and drinking  Eat a soft, bland diet. Avoid eating hot, cold, or salty foods. These can hurt your mouth.  Use a straw if it hurts to drink out of a glass.  Eat foods that have a lot of lysine in them. These include meat, fish, and dairy products.  Avoid sugary foods, chocolates, nuts, and grains. These foods have a high amount of a substance (arginine) that can cause the virus to grow. Lifestyle  Do not kiss, have oral sex, or share personal items until your sores heal.  Stress, poor sleep, and being out in the sun can trigger outbreaks. Make sure you: ? Do activities that help you relax, such as deep breathing exercises or meditation. ? Get enough sleep. ? Apply  sunscreen on your lips before you go out in the sun. Contact a doctor if:  You have symptoms for more than 2 weeks.  You have pus coming from the sores.  You have redness that is spreading.  You have pain or irritation in your eye.  You get sores on your genitals.  Your sores do not heal within 2 weeks.  You get cold sores often. Get help right away if:  You have a fever and your symptoms suddenly get worse.  You have a headache and confusion.  You have tiredness (fatigue).  You do not want to eat as much as normal (loss of appetite).  You have a stiff neck or are sensitive to light. Summary  A cold sore is  a small, fluid-filled sore that forms inside of the mouth or on the lips, gums, nose, chin, or cheeks.  Cold sores can spread from person to person (are contagious) until the sores crust over completely. Most cold sores go away within 2 weeks.  Wash your hands often. Do not touch your eyes without washing your hands first.  Do not kiss, have oral sex, or share personal items until your sores heal.  Contact a doctor if your sores do not heal within 2 weeks. This information is not intended to replace advice given to you by your health care provider. Make sure you discuss any questions you have with your health care provider. Document Revised: 08/20/2018 Document Reviewed: 09/30/2017 Elsevier Patient Education  2021 [employer].

## 2020-07-25 NOTE — Progress Notes (Signed)
Subjective:  Patient ID: Jacob Cross., male    DOB: [birth year]  Age: 50 y.o. MRN: [id number]  CC: Hypertension  This visit occurred during the SARS-CoV-2 public health emergency.  Safety protocols were in place, including screening questions prior to the visit, additional usage of staff PPE, and extensive cleaning of exam room while observing appropriate contact time as indicated for disinfecting solutions.    HPI Jacob Cross. presents for f/up -  He complains of a 3 day hx of painful blister on his left upper lip. He tells me that his BP is well controlled.  Outpatient Medications Prior to Visit  Medication Sig Dispense Refill   BYSTOLIC 5 MG tablet Take 1 tablet (5 mg total) by mouth daily. 90 tablet 1   esomeprazole (NEXIUM) 40 MG capsule TAKE 1 CAPSULE BY MOUTH EVERY DAY 90 capsule 1   BYSTOLIC 5 MG tablet TAKE 1 TABLET BY MOUTH EVERY DAY 90 tablet 0   tadalafil (CIALIS) 20 MG tablet TAKE 1 TABLET (20 MG TOTAL) BY MOUTH DAILY AS NEEDED. 4 tablet 2   No facility-administered medications prior to visit.    ROS Review of Systems  Constitutional: Negative.  Negative for chills, diaphoresis, fatigue and fever.  HENT: Positive for mouth sores.   Eyes: Negative.   Respiratory: Negative for cough, chest tightness, shortness of breath and wheezing.   Cardiovascular: Negative for chest pain, palpitations and leg swelling.  Gastrointestinal: Negative for abdominal pain, diarrhea, nausea and vomiting.  Endocrine: Negative.   Genitourinary: Negative.  Negative for difficulty urinating.  Musculoskeletal: Negative for arthralgias and myalgias.  Skin: Negative.  Negative for color change and pallor.  Neurological: Negative.  Negative for dizziness and weakness.  Hematological: Negative for adenopathy. Does not bruise/bleed easily.  Psychiatric/Behavioral: Negative.     Objective:  BP 136/86    Pulse 61    Temp 98.2 F (36.8 C) (Oral)    Ht 5' 6"  (1.676 m)    Wt 232 lb  (105.2 kg)    SpO2 98%    BMI 37.45 kg/m   BP Readings from Last 3 Encounters:  07/25/20 136/86  08/24/19 132/84  04/30/19 128/80    Wt Readings from Last 3 Encounters:  07/25/20 232 lb (105.2 kg)  08/24/19 232 lb 4 oz (105.3 kg)  05/14/19 230 lb (104.3 kg)    Physical Exam Vitals reviewed.  Constitutional:      Appearance: Normal appearance.  HENT:     Nose: Nose normal.     Mouth/Throat:     Mouth: Mucous membranes are moist.   Eyes:     General: No scleral icterus.    Conjunctiva/sclera: Conjunctivae normal.  Cardiovascular:     Rate and Rhythm: Normal rate and regular rhythm.     Heart sounds: No murmur heard.   Pulmonary:     Effort: Pulmonary effort is normal.     Breath sounds: No stridor. No wheezing, rhonchi or rales.  Abdominal:     General: Abdomen is flat. There is no distension.     Palpations: Abdomen is soft. There is no hepatomegaly.     Tenderness: There is no abdominal tenderness.  Musculoskeletal:        General: Normal range of motion.     Cervical back: Neck supple.     Right lower leg: No edema.     Left lower leg: No edema.  Lymphadenopathy:     Cervical: No cervical adenopathy.  Skin:  General: Skin is warm and dry.  Neurological:     General: No focal deficit present.     Mental Status: He is alert.  Psychiatric:        Mood and Affect: Mood normal.        Behavior: Behavior normal.     Lab Results  Component Value Date   WBC 12.2 (H) 07/25/2020   HGB 15.9 07/25/2020   HCT 48.2 07/25/2020   PLT 198.0 07/25/2020   GLUCOSE 87 07/25/2020   CHOL 192 08/24/2019   TRIG 186.0 (H) 08/24/2019   HDL 39.60 08/24/2019   LDLDIRECT 120.0 11/17/2015   LDLCALC 116 (H) 08/24/2019   ALT 47 05/20/2018   AST 25 05/20/2018   NA 139 07/25/2020   K 3.9 07/25/2020   CL 100 07/25/2020   CREATININE 1.11 07/25/2020   BUN 14 07/25/2020   CO2 32 07/25/2020   TSH 0.77 05/20/2018   PSA 0.41 08/24/2019    Myocardial Perfusion  Imaging  Result Date: 05/14/2019  The left ventricular ejection fraction is normal (55-65%).  Nuclear stress EF: 60%.  There was no ST segment deviation noted during stress.  The study is normal.  This is a low risk study.  Normal pharmacologic nuclear stress test with no evidence for prior infarct or ischemia. Normal LVEF.   Assessment & Plan:   Leemon was seen today for hypertension.  Diagnoses and all orders for this visit:  Essential hypertension, benign- His BP is well controlled. Will cont the current dose of nebivolol. -     CBC with Differential/Platelet; Future -     Basic metabolic panel; Future -     Basic metabolic panel -     CBC with Differential/Platelet  Herpes labialis -     valACYclovir (VALTREX) 1000 MG tablet; Take 2 tablets (2,000 mg total) by mouth 2 (two) times daily for 1 day.   I am having Jacob G. Jacob Cross. start on valACYclovir. I am also having him maintain his Bystolic and esomeprazole.  Meds ordered this encounter  Medications   valACYclovir (VALTREX) 1000 MG tablet    Sig: Take 2 tablets (2,000 mg total) by mouth 2 (two) times daily for 1 day.    Dispense:  4 tablet    Refill:  2     Follow-up: Return in about 3 months (around 10/25/2020).  Scarlette Calico, MD

## 2020-07-26 ENCOUNTER — Other Ambulatory Visit: Payer: Self-pay | Admitting: Internal Medicine

## 2020-07-26 DIAGNOSIS — N522 Drug-induced erectile dysfunction: Secondary | ICD-10-CM

## 2020-08-31 ENCOUNTER — Other Ambulatory Visit: Payer: Self-pay | Admitting: Internal Medicine

## 2020-08-31 DIAGNOSIS — I10 Essential (primary) hypertension: Secondary | ICD-10-CM

## 2020-10-30 ENCOUNTER — Other Ambulatory Visit: Payer: Self-pay | Admitting: Internal Medicine

## 2020-10-30 DIAGNOSIS — K2100 Gastro-esophageal reflux disease with esophagitis, without bleeding: Secondary | ICD-10-CM

## 2020-11-23 ENCOUNTER — Ambulatory Visit (INDEPENDENT_AMBULATORY_CARE_PROVIDER_SITE_OTHER): Payer: BC Managed Care – PPO | Admitting: Internal Medicine

## 2020-11-23 ENCOUNTER — Other Ambulatory Visit: Payer: Self-pay

## 2020-11-23 ENCOUNTER — Encounter: Payer: Self-pay | Admitting: Internal Medicine

## 2020-11-23 VITALS — BP 134/86 | HR 62 | Temp 98.4°F | Ht 66.0 in | Wt 231.0 lb

## 2020-11-23 DIAGNOSIS — Z1159 Encounter for screening for other viral diseases: Secondary | ICD-10-CM | POA: Insufficient documentation

## 2020-11-23 DIAGNOSIS — I10 Essential (primary) hypertension: Secondary | ICD-10-CM | POA: Diagnosis not present

## 2020-11-23 DIAGNOSIS — R001 Bradycardia, unspecified: Secondary | ICD-10-CM | POA: Diagnosis not present

## 2020-11-23 DIAGNOSIS — K2100 Gastro-esophageal reflux disease with esophagitis, without bleeding: Secondary | ICD-10-CM | POA: Diagnosis not present

## 2020-11-23 DIAGNOSIS — Z0000 Encounter for general adult medical examination without abnormal findings: Secondary | ICD-10-CM

## 2020-11-23 LAB — URINALYSIS, ROUTINE W REFLEX MICROSCOPIC
Bilirubin Urine: NEGATIVE
Hgb urine dipstick: NEGATIVE
Ketones, ur: NEGATIVE
Leukocytes,Ua: NEGATIVE
Nitrite: NEGATIVE
RBC / HPF: NONE SEEN (ref 0–?)
Specific Gravity, Urine: 1.01 (ref 1.000–1.030)
Total Protein, Urine: NEGATIVE
Urine Glucose: NEGATIVE
Urobilinogen, UA: 0.2 (ref 0.0–1.0)
WBC, UA: NONE SEEN (ref 0–?)
pH: 7 (ref 5.0–8.0)

## 2020-11-23 LAB — HEPATIC FUNCTION PANEL
ALT: 27 U/L (ref 0–53)
AST: 18 U/L (ref 0–37)
Albumin: 4.7 g/dL (ref 3.5–5.2)
Alkaline Phosphatase: 60 U/L (ref 39–117)
Bilirubin, Direct: 0.1 mg/dL (ref 0.0–0.3)
Total Bilirubin: 0.5 mg/dL (ref 0.2–1.2)
Total Protein: 6.9 g/dL (ref 6.0–8.3)

## 2020-11-23 LAB — LIPID PANEL
Cholesterol: 191 mg/dL (ref 0–200)
HDL: 52.2 mg/dL (ref >39.00)
LDL Cholesterol: 121 mg/dL — ABNORMAL HIGH (ref 0–99)
NonHDL: 139.24
Total CHOL/HDL Ratio: 4
Triglycerides: 93 mg/dL (ref 0.0–149.0)
VLDL: 18.6 mg/dL (ref 0.0–40.0)

## 2020-11-23 LAB — CBC WITH DIFFERENTIAL/PLATELET
Basophils Absolute: 0.1 10*3/uL (ref 0.0–0.1)
Basophils Relative: 0.5 % (ref 0.0–3.0)
Eosinophils Absolute: 0.1 10*3/uL (ref 0.0–0.7)
Eosinophils Relative: 1.3 % (ref 0.0–5.0)
HCT: 46.7 % (ref 39.0–52.0)
Hemoglobin: 15.6 g/dL (ref 13.0–17.0)
Lymphocytes Relative: 23.2 % (ref 12.0–46.0)
Lymphs Abs: 2.4 10*3/uL (ref 0.7–4.0)
MCHC: 33.4 g/dL (ref 30.0–36.0)
MCV: 91.3 fl (ref 78.0–100.0)
Monocytes Absolute: 1 10*3/uL (ref 0.1–1.0)
Monocytes Relative: 9 % (ref 3.0–12.0)
Neutro Abs: 6.9 10*3/uL (ref 1.4–7.7)
Neutrophils Relative %: 66 % (ref 43.0–77.0)
Platelets: 180 10*3/uL (ref 150.0–400.0)
RBC: 5.12 Mil/uL (ref 4.22–5.81)
RDW: 14.5 % (ref 11.5–15.5)
WBC: 10.5 10*3/uL (ref 4.0–10.5)

## 2020-11-23 LAB — PSA: PSA: 0.42 ng/mL (ref 0.10–4.00)

## 2020-11-23 MED ORDER — INDAPAMIDE 1.25 MG PO TABS: 1.2500 mg | ORAL_TABLET | Freq: Every day | ORAL | 0 refills | Status: DC

## 2020-11-23 NOTE — Progress Notes (Signed)
radycardia  Subjective:  Patient ID: Jacob Revel., male    DOB: 1970-07-20  Age: 50 y.o. MRN: [id number]  CC: Annual Exam and Hypertension  This visit occurred during the SARS-CoV-2 public health emergency.  Safety protocols were in place, including screening questions prior to the visit, additional usage of staff PPE, and extensive cleaning of exam room while observing appropriate contact time as indicated for disinfecting solutions.    HPI Jacob Cross. presents for a CPX and f/up -   He tells me his blood pressure has been well controlled.  He has not been exercising much but when he is active he does not experience CP, DOE, palpitations, edema, fatigue, dizziness, lightheadedness, or presyncope.  Outpatient Medications Prior to Visit  Medication Sig Dispense Refill   esomeprazole (NEXIUM) 40 MG capsule TAKE 1 CAPSULE BY MOUTH EVERY DAY 90 capsule 1   tadalafil (CIALIS) 20 MG tablet TAKE 1 TABLET BY MOUTH EVERY DAY AS NEEDED 4 tablet 2   BYSTOLIC 5 MG tablet TAKE 1 TABLET (5 MG TOTAL) BY MOUTH DAILY. 90 tablet 1   No facility-administered medications prior to visit.    ROS Review of Systems  Constitutional:  Negative for diaphoresis, fatigue and unexpected weight change.  HENT: Negative.    Eyes: Negative.   Respiratory:  Positive for apnea. Negative for cough, chest tightness, shortness of breath and wheezing.        He is not using CPAP  Cardiovascular:  Negative for chest pain, palpitations and leg swelling.  Gastrointestinal:  Negative for abdominal pain, constipation, diarrhea, nausea and vomiting.  Endocrine: Negative for cold intolerance and heat intolerance.  Genitourinary: Negative.  Negative for difficulty urinating, scrotal swelling and testicular pain.  Musculoskeletal: Negative.  Negative for arthralgias.  Skin: Negative.   Neurological: Negative.  Negative for dizziness, weakness and light-headedness.  Hematological:  Negative for adenopathy. Does not  bruise/bleed easily.  Psychiatric/Behavioral: Negative.     Objective:  BP 134/86 (BP Location: Left Arm, Patient Position: Sitting, Cuff Size: Large)   Pulse 62   Temp 98.4 F (36.9 C) (Oral)   Ht _0  (1.676 m)   Wt 231 lb (104.8 kg)   SpO2 97%   BMI 37.28 kg/m   BP Readings from Last 3 Encounters:  11/23/20 134/86  07/25/20 136/86  08/24/19 132/84    Wt Readings from Last 3 Encounters:  11/23/20 231 lb (104.8 kg)  07/25/20 232 lb (105.2 kg)  08/24/19 232 lb 4 oz (105.3 kg)    Physical Exam Vitals reviewed.  Constitutional:      Appearance: He is obese.  HENT:     Mouth/Throat:     Mouth: Mucous membranes are moist.  Eyes:     Conjunctiva/sclera: Conjunctivae normal.  Cardiovascular:     Rate and Rhythm: Regular rhythm. Bradycardia present.     Heart sounds: No murmur heard.   No gallop.     Comments: EKG- Sinus bradycardia, 56 bpm + LVH No Q waves Pulmonary:     Effort: Pulmonary effort is normal.     Breath sounds: No stridor. No wheezing, rhonchi or rales.  Abdominal:     General: Abdomen is protuberant. Bowel sounds are normal. There is no distension.     Palpations: Abdomen is soft. There is no hepatomegaly, splenomegaly or mass.     Tenderness: There is no abdominal tenderness. There is no guarding.     Hernia: There is no hernia in the left inguinal area or  right inguinal area.  Genitourinary:    Pubic Area: No rash.      Penis: Normal and circumcised.      Testes: Normal.     Epididymis:     Right: Normal.     Left: Normal.  Musculoskeletal:        General: No swelling.     Cervical back: Neck supple.     Right lower leg: Edema (trace pitting) present.     Left lower leg: Edema (trace pitting) present.  Lymphadenopathy:     Cervical: No cervical adenopathy.     Lower Body: No right inguinal adenopathy. No left inguinal adenopathy.  Skin:    General: Skin is warm and dry.  Neurological:     General: No focal deficit present.     Mental  Status: He is alert.  Psychiatric:        Mood and Affect: Mood normal.        Behavior: Behavior normal.    Lab Results  Component Value Date   WBC 10.5 11/23/2020   HGB 15.6 11/23/2020   HCT 46.7 11/23/2020   PLT 180.0 11/23/2020   GLUCOSE 87 07/25/2020   CHOL 191 11/23/2020   TRIG 93.0 11/23/2020   HDL 52.20 11/23/2020   LDLDIRECT 120.0 11/17/2015   LDLCALC 121 (H) 11/23/2020   ALT 27 11/23/2020   AST 18 11/23/2020   NA 139 07/25/2020   K 3.9 07/25/2020   CL 100 07/25/2020   CREATININE 1.11 07/25/2020   BUN 14 07/25/2020   CO2 32 07/25/2020   TSH 1.39 11/23/2020   PSA 0.42 11/23/2020    Myocardial Perfusion Imaging  Result Date: 05/14/2019  The left ventricular ejection fraction is normal (55-65%).  Nuclear stress EF: 60%.  There was no ST segment deviation noted during stress.  The study is normal.  This is a low risk study.  Normal pharmacologic nuclear stress test with no evidence for prior infarct or ischemia. Normal LVEF.   Assessment & Plan:   Jacob Cross was seen today for annual exam and hypertension.  Diagnoses and all orders for this visit:  Essential hypertension, benign- He has not achieved his blood pressure goal of 130/80.  He also has a low heart rate.  I recommended that he discontinue nebivolol and to start indapamide.  I also encouraged him to improve his lifestyle modifications. -     CBC with Differential/Platelet; Future -     Hepatic function panel; Future -     Urinalysis, Routine w reflex microscopic; Future -     EKG 12-Lead -     Thyroid Panel With TSH; Future -     indapamide (LOZOL) 1.25 MG tablet; Take 1 tablet (1.25 mg total) by mouth daily. -     Thyroid Panel With TSH -     Urinalysis, Routine w reflex microscopic -     Hepatic function panel -     CBC with Differential/Platelet  Gastroesophageal reflux disease with esophagitis without hemorrhage- His symptoms are well controlled.  No complications related to this. -     CBC  with Differential/Platelet; Future -     CBC with Differential/Platelet  Routine general medical examination at a health care facility- Exam completed, labs reviewed-statin therapy is not indicated, I encouraged him to complete the Cologuard kit, patient had material was given. -     Lipid panel; Future -     PSA; Future -     Hepatitis C antibody; Future -  Hepatitis C antibody -     PSA -     Lipid panel  Need for hepatitis C screening test -     Hepatitis C antibody; Future -     Hepatitis C antibody  Bradycardia- Labs are negative for secondary causes.  He has no symptoms related to this.  Will discontinue nebivolol. -     Thyroid Panel With TSH; Future -     Thyroid Panel With TSH  I have discontinued Jacob G. Hainline Jr.'s Bystolic. I am also having him start on indapamide. Additionally, I am having him maintain his tadalafil and esomeprazole.  Meds ordered this encounter  Medications   indapamide (LOZOL) 1.25 MG tablet    Sig: Take 1 tablet (1.25 mg total) by mouth daily.    Dispense:  90 tablet    Refill:  0      Follow-up: Return in about 6 months (around 05/26/2021).  Scarlette Calico, MD

## 2020-11-23 NOTE — Patient Instructions (Signed)

## 2020-11-24 LAB — THYROID PANEL WITH TSH
Free Thyroxine Index: 2.3 (ref 1.4–3.8)
T3 Uptake: 30 % (ref 22–35)
T4, Total: 7.7 ug/dL (ref 4.9–10.5)
TSH: 1.39 mIU/L (ref 0.40–4.50)

## 2020-11-24 LAB — HEPATITIS C ANTIBODY
Hepatitis C Ab: NONREACTIVE
SIGNAL TO CUT-OFF: 0.01 (ref <1.00)

## 2020-11-26 ENCOUNTER — Other Ambulatory Visit: Payer: Self-pay | Admitting: Internal Medicine

## 2020-11-26 DIAGNOSIS — Z1211 Encounter for screening for malignant neoplasm of colon: Secondary | ICD-10-CM

## 2020-11-27 ENCOUNTER — Encounter: Payer: Self-pay | Admitting: Internal Medicine

## 2020-11-28 ENCOUNTER — Other Ambulatory Visit: Payer: Self-pay | Admitting: Internal Medicine

## 2020-11-28 DIAGNOSIS — I10 Essential (primary) hypertension: Secondary | ICD-10-CM

## 2020-11-28 MED ORDER — NEBIVOLOL HCL 2.5 MG PO TABS
2.5000 mg | ORAL_TABLET | Freq: Every day | ORAL | 0 refills | Status: DC
Start: 2020-11-28 — End: 2021-03-07

## 2021-02-19 ENCOUNTER — Other Ambulatory Visit: Payer: Self-pay | Admitting: Internal Medicine

## 2021-02-19 DIAGNOSIS — I10 Essential (primary) hypertension: Secondary | ICD-10-CM

## 2021-02-23 ENCOUNTER — Ambulatory Visit (INDEPENDENT_AMBULATORY_CARE_PROVIDER_SITE_OTHER): Payer: BC Managed Care – PPO | Admitting: Internal Medicine

## 2021-02-23 ENCOUNTER — Encounter: Payer: Self-pay | Admitting: Internal Medicine

## 2021-02-23 ENCOUNTER — Other Ambulatory Visit: Payer: Self-pay

## 2021-02-23 VITALS — BP 136/80 | HR 72 | Temp 98.4°F | Ht 66.0 in | Wt 236.0 lb

## 2021-02-23 DIAGNOSIS — I10 Essential (primary) hypertension: Secondary | ICD-10-CM | POA: Diagnosis not present

## 2021-02-23 DIAGNOSIS — Z302 Encounter for sterilization: Secondary | ICD-10-CM

## 2021-02-23 DIAGNOSIS — M5416 Radiculopathy, lumbar region: Secondary | ICD-10-CM | POA: Diagnosis not present

## 2021-02-23 DIAGNOSIS — M5442 Lumbago with sciatica, left side: Secondary | ICD-10-CM

## 2021-02-23 DIAGNOSIS — G8929 Other chronic pain: Secondary | ICD-10-CM

## 2021-02-23 MED ORDER — ETODOLAC ER 400 MG PO TB24: 400.0000 mg | ORAL_TABLET | Freq: Every day | ORAL | 0 refills | Status: DC

## 2021-02-23 NOTE — Patient Instructions (Signed)
Lumbosacral Radiculopathy Lumbosacral radiculopathy is a condition that involves the spinal nerves and nerve roots in the low back and bottom of the spine. The condition develops when these nerves and nerve roots move out of place or become inflamed and cause symptoms. What are the causes? This condition may be caused by: Pressure from a disk that bulges out of place (herniated disk). A disk is a plate of soft cartilage that separates bones in the spine. Disk changes that occur with age (disk degeneration). A narrowing of the bones of the lower back (spinal stenosis). A tumor. An infection. An injury that places sudden pressure on the disks that cushion the bones of your lower spine. What increases the risk? You are more likely to develop this condition if: You are a male who is 55-61 years old. You are a male who is 25-24 years old. You use improper technique when lifting things. You are overweight or live a sedentary lifestyle. You smoke. Your work requires frequent lifting. You do repetitive activities that strain the spine. What are the signs or symptoms? Symptoms of this condition include: Pain that goes down from your back into your legs (sciatica), usually on one side of the body. This is the most common symptom. The pain may be worse with sitting, coughing, or sneezing. Pain and numbness in your legs. Muscle weakness. Tingling. Loss of bladder control or bowel control. How is this diagnosed? This condition may be diagnosed based on: Your symptoms and medical history. A physical exam. If the pain is lasting, you may have tests, such as: MRI scan. X-ray. CT scan. A type of X-ray used to examine the spinal canal after injecting a dye into your spine (myelogram). A test to measure how electrical impulses move through a nerve (nerve conduction study). How is this treated? Treatment may depend on the cause of the condition and may include: Working with a physical  therapist. Taking pain medicine. Applying heat and ice to affected areas. Doing stretches to improve flexibility. Doing exercises to strengthen back muscles. Having chiropractic spinal manipulation. Using transcutaneous electrical nerve stimulation (TENS) therapy. Getting a steroid injection in the spine. In some cases, no treatment is needed. If the condition is long-lasting (chronic), or if symptoms are severe, treatment may involve surgery or lifestyle changes, such as following a weight-loss plan. Follow these instructions at home: Activity Avoid bending and other activities that make the problem worse. Maintain a proper position when standing or sitting: When standing, keep your upper back and neck straight, with your shoulders pulled back. Avoid slouching. When sitting, keep your back straight and relax your shoulders. Do not round your shoulders or pull them backward. Do not sit or stand in one place for long periods of time. Take brief periods of rest throughout the day. This will reduce your pain. It is usually better to rest by lying down or standing, not sitting. When you are resting for longer periods, mix in some mild activity or stretching between periods of rest. This will help to prevent stiffness and pain. Get regular exercise. Ask your health care provider what activities are safe for you. If you were shown how to do any exercises or stretches, do them as directed by your health care provider. Do not lift anything that is heavier than 10 lb (4.5 kg) or the limit that you are told by your health care provider. Always use proper lifting technique, which includes: Bending your knees. Keeping the load close to your body. Avoiding twisting.  Managing pain If directed, put ice on the affected area: Put ice in a plastic bag. Place a towel between your skin and the bag. Leave the ice on for 20 minutes, 2-3 times a day. If directed, apply heat to the affected area as often as told  by your health care provider. Use the heat source that your health care provider recommends, such as a moist heat pack or a heating pad. Place a towel between your skin and the heat source. Leave the heat on for 20-30 minutes. Remove the heat if your skin turns bright red. This is especially important if you are unable to feel pain, heat, or cold. You may have a greater risk of getting burned. Take over-the-counter and prescription medicines only as told by your health care provider. General instructions Sleep on a firm mattress in a comfortable position. Try lying on your side with your knees slightly bent. If you lie on your back, put a pillow under your knees. Do not drive or use heavy machinery while taking prescription pain medicine. If your health care provider prescribed a diet or exercise program, follow it as directed. Keep all follow-up visits as told by your health care provider. This is important. Contact a health care provider if: Your pain does not improve over time, even when taking pain medicines. Get help right away if: You develop severe pain. Your pain suddenly gets worse. You develop increasing weakness in your legs. You lose the ability to control your bladder or bowel. You have difficulty walking or balancing. You have a fever. Summary Lumbosacral radiculopathy is a condition that occurs when the spinal nerves and nerve roots in the lower part of the spine move out of place or become inflamed and cause symptoms. Symptoms include pain, numbness, and tingling that go down from your back into your legs (sciatica), muscle weakness, and loss of bladder control or bowel control. If directed, apply ice or heat to the affected area as told by your health care provider. Follow instructions about activity, rest, and proper lifting technique. This information is not intended to replace advice given to you by your health care provider. Make sure you discuss any questions you have  with your health care provider. Document Revised: 03/07/2020 Document Reviewed: 03/10/2020 Elsevier Patient Education  2022 [employer].

## 2021-02-23 NOTE — Progress Notes (Signed)
Subjective:  Patient ID: Jacob Revel., male    DOB: 08/13/1970  Age: 50 y.o. MRN: [id number]  CC: Hypertension and Back Pain  This visit occurred during the SARS-CoV-2 public health emergency.  Safety protocols were in place, including screening questions prior to the visit, additional usage of staff PPE, and extensive cleaning of exam room while observing appropriate contact time as indicated for disinfecting solutions.    HPI Jacob Schanz. presents for f/up -  He complains of an 70-monthhistory of left lower back pain that radiates into his left upper thigh.  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  He is also been seeing a chiropractor without much relief.  It sounds like he has been treated with lasers and was told that he needed a epidural nerve block which he does not want to do.  He tells me he has not had an MRI.  He describes weakness in the left lower leg but no numbness or tingling.  He has not gotten much symptom relief with Tylenol. He wants to undergo a vasectomy.  Outpatient Medications Prior to Visit  Medication Sig Dispense Refill   esomeprazole (NEXIUM) 40 MG capsule TAKE 1 CAPSULE BY MOUTH EVERY DAY 90 capsule 1   indapamide (LOZOL) 1.25 MG tablet TAKE 1 TABLET BY MOUTH DAILY. 90 tablet 0   nebivolol (BYSTOLIC) 2.5 MG tablet Take 1 tablet (2.5 mg total) by mouth daily. 90 tablet 0   tadalafil (CIALIS) 20 MG tablet TAKE 1 TABLET BY MOUTH EVERY DAY AS NEEDED 4 tablet 2   No facility-administered medications prior to visit.    ROS Review of Systems  Constitutional:  Negative for diaphoresis, fatigue and unexpected weight change.  HENT: Negative.    Eyes: Negative.   Respiratory:  Negative for cough, chest tightness, shortness of breath and wheezing.   Cardiovascular:  Negative for chest pain, palpitations and leg swelling.  Gastrointestinal:  Negative for abdominal pain, diarrhea, nausea and  vomiting.  Endocrine: Negative.   Genitourinary: Negative.  Negative for difficulty urinating.  Musculoskeletal:  Positive for back pain.  Skin: Negative.   Neurological:  Positive for weakness. Negative for numbness.  Hematological:  Negative for adenopathy. Does not bruise/bleed easily.  Psychiatric/Behavioral: Negative.     Objective:  BP 136/80 (BP Location: Left Arm, Patient Position: Sitting, Cuff Size: Large)   Pulse 72   Temp 98.4 F (36.9 C) (Oral)   Ht 5' 6"  (1.676 m)   Wt 236 lb (107 kg)   SpO2 95%   BMI 38.09 kg/m   BP Readings from Last 3 Encounters:  02/23/21 136/80  11/23/20 134/86  07/25/20 136/86    Wt Readings from Last 3 Encounters:  02/23/21 236 lb (107 kg)  11/23/20 231 lb (104.8 kg)  07/25/20 232 lb (105.2 kg)    Physical Exam Vitals reviewed.  Constitutional:      Appearance: He is obese.  HENT:     Nose: Nose normal.     Mouth/Throat:     Mouth: Mucous membranes are moist.  Eyes:     Conjunctiva/sclera: Conjunctivae normal.  Neck:     Vascular: No carotid bruit.  Cardiovascular:     Rate and Rhythm: Normal rate and regular rhythm.     Heart sounds: No murmur heard. Pulmonary:     Effort: Pulmonary effort is normal.     Breath sounds: No stridor. No wheezing, rhonchi  or rales.  Abdominal:     General: Abdomen is protuberant. Bowel sounds are normal. There is no distension.     Palpations: Abdomen is soft. There is no hepatomegaly, splenomegaly or mass.     Tenderness: There is no abdominal tenderness. There is no guarding.  Musculoskeletal:        General: Normal range of motion.     Cervical back: Normal and neck supple.     Thoracic back: Normal.     Lumbar back: No swelling, deformity or bony tenderness. Normal range of motion. Negative right straight leg raise test and negative left straight leg raise test.     Right lower leg: No edema.     Left lower leg: No edema.  Lymphadenopathy:     Cervical: No cervical adenopathy.   Skin:    General: Skin is warm and dry.     Findings: No rash.  Neurological:     General: No focal deficit present.     Mental Status: He is alert. Mental status is at baseline.     Sensory: No sensory deficit.     Motor: No weakness.     Gait: Gait normal.     Deep Tendon Reflexes: Reflexes normal.     Reflex Scores:      Tricep reflexes are 1+ on the right side and 1+ on the left side.      Bicep reflexes are 1+ on the right side and 1+ on the left side.      Brachioradialis reflexes are 1+ on the right side and 1+ on the left side.      Patellar reflexes are 1+ on the right side and 1+ on the left side.      Achilles reflexes are 1+ on the right side and 1+ on the left side. Psychiatric:        Mood and Affect: Mood normal.        Behavior: Behavior normal.    Lab Results  Component Value Date   WBC 10.5 11/23/2020   HGB 15.6 11/23/2020   HCT 46.7 11/23/2020   PLT 180.0 11/23/2020   GLUCOSE 87 07/25/2020   CHOL 191 11/23/2020   TRIG 93.0 11/23/2020   HDL 52.20 11/23/2020   LDLDIRECT 120.0 11/17/2015   LDLCALC 121 (H) 11/23/2020   ALT 27 11/23/2020   AST 18 11/23/2020   NA 139 07/25/2020   K 3.9 07/25/2020   CL 100 07/25/2020   CREATININE 1.11 07/25/2020   BUN 14 07/25/2020   CO2 32 07/25/2020   TSH 1.39 11/23/2020   PSA 0.42 11/23/2020    Myocardial Perfusion Imaging  Result Date: 05/14/2019  The left ventricular ejection fraction is normal (55-65%).  Nuclear stress EF: 60%.  There was no ST segment deviation noted during stress.  The study is normal.  This is a low risk study.  Normal pharmacologic nuclear stress test with no evidence for prior infarct or ischemia. Normal LVEF.   Assessment & Plan:   Jacob Cross was seen today for hypertension and back pain.  Diagnoses and all orders for this visit:  Left lumbar radiculitis- I recommended that he undergo an MRI of the lumbar spine to see if there is spinal stenosis, nerve impingement, or tumor.   Something that would be treated with surgical intervention. -     etodolac (LODINE XL) 400 MG 24 hr tablet; Take 1 tablet (400 mg total) by mouth daily. -     MR Lumbar Spine Wo Contrast;  Future  Chronic left-sided low back pain with left-sided sciatica- I recommended that he start taking an NSAID. -     etodolac (LODINE XL) 400 MG 24 hr tablet; Take 1 tablet (400 mg total) by mouth daily. -     MR Lumbar Spine Wo Contrast; Future  Essential hypertension, benign- His blood pressure is adequately well controlled.  Encounter for sterilization in male -     Ambulatory referral to Urology  I am having Jacob Cross. start on etodolac. I am also having him maintain his tadalafil, esomeprazole, nebivolol, and indapamide.  Meds ordered this encounter  Medications   etodolac (LODINE XL) 400 MG 24 hr tablet    Sig: Take 1 tablet (400 mg total) by mouth daily.    Dispense:  90 tablet    Refill:  0      Follow-up: Return in about 3 months (around 05/26/2021).  Scarlette Calico, MD

## 2021-02-24 DIAGNOSIS — Z302 Encounter for sterilization: Secondary | ICD-10-CM | POA: Insufficient documentation

## 2021-02-24 DIAGNOSIS — Z0001 Encounter for general adult medical examination with abnormal findings: Secondary | ICD-10-CM | POA: Insufficient documentation

## 2021-03-07 ENCOUNTER — Other Ambulatory Visit: Payer: Self-pay | Admitting: Internal Medicine

## 2021-03-07 DIAGNOSIS — I10 Essential (primary) hypertension: Secondary | ICD-10-CM

## 2021-03-09 ENCOUNTER — Other Ambulatory Visit: Payer: Self-pay | Admitting: Internal Medicine

## 2021-03-09 DIAGNOSIS — N522 Drug-induced erectile dysfunction: Secondary | ICD-10-CM

## 2021-03-19 ENCOUNTER — Ambulatory Visit
Admission: RE | Admit: 2021-03-19 | Discharge: 2021-03-19 | Disposition: A | Discharge status: Home / Self Care | Payer: BC Managed Care – PPO | Source: Ambulatory Visit | Attending: Internal Medicine | Admitting: Internal Medicine

## 2021-03-19 ENCOUNTER — Other Ambulatory Visit: Payer: Self-pay

## 2021-03-19 DIAGNOSIS — G8929 Other chronic pain: Secondary | ICD-10-CM

## 2021-03-19 DIAGNOSIS — M5416 Radiculopathy, lumbar region: Secondary | ICD-10-CM

## 2021-03-21 ENCOUNTER — Other Ambulatory Visit: Payer: Self-pay | Admitting: Internal Medicine

## 2021-03-21 DIAGNOSIS — M48062 Spinal stenosis, lumbar region with neurogenic claudication: Secondary | ICD-10-CM | POA: Insufficient documentation

## 2021-04-21 ENCOUNTER — Other Ambulatory Visit: Payer: Self-pay | Admitting: Internal Medicine

## 2021-04-21 DIAGNOSIS — K2100 Gastro-esophageal reflux disease with esophagitis, without bleeding: Secondary | ICD-10-CM

## 2021-05-24 ENCOUNTER — Other Ambulatory Visit: Payer: Self-pay | Admitting: Internal Medicine

## 2021-05-24 DIAGNOSIS — M5442 Lumbago with sciatica, left side: Secondary | ICD-10-CM

## 2021-05-24 DIAGNOSIS — G8929 Other chronic pain: Secondary | ICD-10-CM

## 2021-05-24 DIAGNOSIS — M5416 Radiculopathy, lumbar region: Secondary | ICD-10-CM

## 2021-05-29 ENCOUNTER — Encounter: Payer: Self-pay | Admitting: Internal Medicine

## 2021-05-31 ENCOUNTER — Ambulatory Visit (INDEPENDENT_AMBULATORY_CARE_PROVIDER_SITE_OTHER): Payer: BC Managed Care – PPO

## 2021-05-31 ENCOUNTER — Ambulatory Visit (INDEPENDENT_AMBULATORY_CARE_PROVIDER_SITE_OTHER): Payer: BC Managed Care – PPO | Admitting: Internal Medicine

## 2021-05-31 ENCOUNTER — Encounter: Payer: Self-pay | Admitting: Internal Medicine

## 2021-05-31 ENCOUNTER — Other Ambulatory Visit: Payer: Self-pay

## 2021-05-31 VITALS — BP 138/84 | HR 80 | Temp 98.7°F | Ht 66.0 in | Wt 234.0 lb

## 2021-05-31 DIAGNOSIS — U071 COVID-19: Secondary | ICD-10-CM

## 2021-05-31 DIAGNOSIS — I10 Essential (primary) hypertension: Secondary | ICD-10-CM | POA: Diagnosis not present

## 2021-05-31 DIAGNOSIS — M5442 Lumbago with sciatica, left side: Secondary | ICD-10-CM

## 2021-05-31 DIAGNOSIS — G8929 Other chronic pain: Secondary | ICD-10-CM

## 2021-05-31 DIAGNOSIS — R052 Subacute cough: Secondary | ICD-10-CM

## 2021-05-31 DIAGNOSIS — J208 Acute bronchitis due to other specified organisms: Secondary | ICD-10-CM | POA: Insufficient documentation

## 2021-05-31 LAB — BASIC METABOLIC PANEL
BUN: 13 mg/dL (ref 6–23)
CO2: 29 mEq/L (ref 19–32)
Calcium: 9.2 mg/dL (ref 8.4–10.5)
Chloride: 101 mEq/L (ref 96–112)
Creatinine, Ser: 1.21 mg/dL (ref 0.40–1.50)
GFR: 69.77 mL/min (ref >60.00)
Glucose, Bld: 101 mg/dL — ABNORMAL HIGH (ref 70–99)
Potassium: 4.1 mEq/L (ref 3.5–5.1)
Sodium: 139 mEq/L (ref 135–145)

## 2021-05-31 LAB — CBC WITH DIFFERENTIAL/PLATELET
Basophils Absolute: 0 10*3/uL (ref 0.0–0.1)
Basophils Relative: 0.4 % (ref 0.0–3.0)
Eosinophils Absolute: 0.4 10*3/uL (ref 0.0–0.7)
Eosinophils Relative: 4.5 % (ref 0.0–5.0)
HCT: 46.9 % (ref 39.0–52.0)
Hemoglobin: 15.3 g/dL (ref 13.0–17.0)
Lymphocytes Relative: 26 % (ref 12.0–46.0)
Lymphs Abs: 2.5 10*3/uL (ref 0.7–4.0)
MCHC: 32.6 g/dL (ref 30.0–36.0)
MCV: 91 fl (ref 78.0–100.0)
Monocytes Absolute: 1.3 10*3/uL — ABNORMAL HIGH (ref 0.1–1.0)
Monocytes Relative: 13.6 % — ABNORMAL HIGH (ref 3.0–12.0)
Neutro Abs: 5.2 10*3/uL (ref 1.4–7.7)
Neutrophils Relative %: 55.5 % (ref 43.0–77.0)
Platelets: 205 10*3/uL (ref 150.0–400.0)
RBC: 5.16 Mil/uL (ref 4.22–5.81)
RDW: 14.1 % (ref 11.5–15.5)
WBC: 9.4 10*3/uL (ref 4.0–10.5)

## 2021-05-31 LAB — SARS-COV-2 IGG: SARS-COV-2 IgG: 39.49

## 2021-05-31 MED ORDER — HYDROCODONE BIT-HOMATROP MBR 5-1.5 MG/5ML PO SOLN: 5.0000 mL | Freq: Three times a day (TID) | ORAL | 0 refills | Status: DC | PRN
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
## 2021-05-31 NOTE — Patient Instructions (Signed)

## 2021-06-15 LAB — COLOGUARD: COLOGUARD: NEGATIVE

## 2021-06-19 ENCOUNTER — Telehealth: Payer: Self-pay | Admitting: Internal Medicine

## 2021-06-19 NOTE — Telephone Encounter (Signed)
Did not receive all of pages regarding pt. States it says 24 pages, but there are 2 pts combined in two faxes. Please resend over fax.    Jacob Cross([phone])-  needing pages [address]- needing 8-24.   Callback #- [phone] [name] #- [phone]

## 2021-06-25 ENCOUNTER — Other Ambulatory Visit: Payer: Self-pay | Admitting: Internal Medicine

## 2021-06-25 DIAGNOSIS — U071 COVID-19: Secondary | ICD-10-CM

## 2021-06-25 DIAGNOSIS — J208 Acute bronchitis due to other specified organisms: Secondary | ICD-10-CM

## 2021-06-26 MED ORDER — HYDROCODONE BIT-HOMATROP MBR 5-1.5 MG/5ML PO SOLN: 5.0000 mL | Freq: Three times a day (TID) | ORAL | 0 refills | Status: DC | PRN

## 2021-08-29 ENCOUNTER — Encounter: Payer: Self-pay | Admitting: Internal Medicine

## 2021-09-04 ENCOUNTER — Other Ambulatory Visit: Payer: Self-pay | Admitting: Internal Medicine

## 2021-09-04 DIAGNOSIS — I10 Essential (primary) hypertension: Secondary | ICD-10-CM

## 2021-09-04 DIAGNOSIS — N522 Drug-induced erectile dysfunction: Secondary | ICD-10-CM

## 2021-10-05 ENCOUNTER — Ambulatory Visit: Payer: BC Managed Care – PPO | Admitting: Internal Medicine

## 2021-10-06 ENCOUNTER — Other Ambulatory Visit: Payer: Self-pay | Admitting: Internal Medicine

## 2021-10-06 DIAGNOSIS — K2100 Gastro-esophageal reflux disease with esophagitis, without bleeding: Secondary | ICD-10-CM

## 2021-11-06 ENCOUNTER — Other Ambulatory Visit: Payer: Self-pay | Admitting: Internal Medicine

## 2021-11-06 DIAGNOSIS — I10 Essential (primary) hypertension: Secondary | ICD-10-CM

## 2021-11-27 ENCOUNTER — Encounter: Payer: Self-pay | Admitting: Internal Medicine

## 2021-11-27 ENCOUNTER — Ambulatory Visit (INDEPENDENT_AMBULATORY_CARE_PROVIDER_SITE_OTHER): Payer: BC Managed Care – PPO | Admitting: Internal Medicine

## 2021-11-27 VITALS — BP 132/86 | HR 61 | Temp 98.3°F | Ht 66.0 in | Wt 238.0 lb

## 2021-11-27 DIAGNOSIS — R9431 Abnormal electrocardiogram [ECG] [EKG]: Secondary | ICD-10-CM | POA: Diagnosis not present

## 2021-11-27 DIAGNOSIS — I10 Essential (primary) hypertension: Secondary | ICD-10-CM

## 2021-11-27 DIAGNOSIS — Z0000 Encounter for general adult medical examination without abnormal findings: Secondary | ICD-10-CM | POA: Diagnosis not present

## 2021-11-27 DIAGNOSIS — R0609 Other forms of dyspnea: Secondary | ICD-10-CM | POA: Insufficient documentation

## 2021-11-27 LAB — LIPID PANEL
Cholesterol: 188 mg/dL (ref 0–200)
HDL: 48.6 mg/dL (ref >39.00)
LDL Cholesterol: 109 mg/dL — ABNORMAL HIGH (ref 0–99)
NonHDL: 138.91
Total CHOL/HDL Ratio: 4
Triglycerides: 149 mg/dL (ref 0.0–149.0)
VLDL: 29.8 mg/dL (ref 0.0–40.0)

## 2021-11-27 LAB — BASIC METABOLIC PANEL
BUN: 16 mg/dL (ref 6–23)
CO2: 29 mEq/L (ref 19–32)
Calcium: 9.7 mg/dL (ref 8.4–10.5)
Chloride: 102 mEq/L (ref 96–112)
Creatinine, Ser: 1.17 mg/dL (ref 0.40–1.50)
GFR: 72.39 mL/min (ref >60.00)
Glucose, Bld: 87 mg/dL (ref 70–99)
Potassium: 4.2 mEq/L (ref 3.5–5.1)
Sodium: 142 mEq/L (ref 135–145)

## 2021-11-27 LAB — TROPONIN I (HIGH SENSITIVITY): High Sens Troponin I: 6 ng/L (ref 2–17)

## 2021-11-27 LAB — BRAIN NATRIURETIC PEPTIDE: Pro B Natriuretic peptide (BNP): 8 pg/mL (ref 0.0–100.0)

## 2021-11-27 LAB — PSA: PSA: 1.35 ng/mL (ref 0.10–4.00)
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
## 2021-11-27 NOTE — Patient Instructions (Signed)
Health Maintenance, Male Adopting a healthy lifestyle and getting preventive care are important in promoting health and wellness. Ask your health care provider about: The right schedule for you to have regular tests and exams. Things you can do on your own to prevent diseases and keep yourself healthy. What should I know about diet, weight, and exercise? Eat a healthy diet  Eat a diet that includes plenty of vegetables, fruits, low-fat dairy products, and lean protein. Do not eat a lot of foods that are high in solid fats, added sugars, or sodium. Maintain a healthy weight Body mass index (BMI) is a measurement that can be used to identify possible weight problems. It estimates body fat based on height and weight. Your health care provider can help determine your BMI and help you achieve or maintain a healthy weight. Get regular exercise Get regular exercise. This is one of the most important things you can do for your health. Most adults should: Exercise for at least 150 minutes each week. The exercise should increase your heart rate and make you sweat (moderate-intensity exercise). Do strengthening exercises at least twice a week. This is in addition to the moderate-intensity exercise. Spend less time sitting. Even light physical activity can be beneficial. Watch cholesterol and blood lipids Have your blood tested for lipids and cholesterol at 51 years of age, then have this test every 5 years. You may need to have your cholesterol levels checked more often if: Your lipid or cholesterol levels are high. You are older than 51 years of age. You are at high risk for heart disease. What should I know about cancer screening? Many types of cancers can be detected early and may often be prevented. Depending on your health history and family history, you may need to have cancer screening at various ages. This may include screening for: Colorectal cancer. Prostate cancer. Skin cancer. Lung  cancer. What should I know about heart disease, diabetes, and high blood pressure? Blood pressure and heart disease High blood pressure causes heart disease and increases the risk of stroke. This is more likely to develop in people who have high blood pressure readings or are overweight. Talk with your health care provider about your target blood pressure readings. Have your blood pressure checked: Every 3-5 years if you are 18-39 years of age. Every year if you are 40 years old or older. If you are between the ages of 65 and 75 and are a current or former smoker, ask your health care provider if you should have a one-time screening for abdominal aortic aneurysm (AAA). Diabetes Have regular diabetes screenings. This checks your fasting blood sugar level. Have the screening done: Once every three years after age 45 if you are at a normal weight and have a low risk for diabetes. More often and at a younger age if you are overweight or have a high risk for diabetes. What should I know about preventing infection? Hepatitis B If you have a higher risk for hepatitis B, you should be screened for this virus. Talk with your health care provider to find out if you are at risk for hepatitis B infection. Hepatitis C Blood testing is recommended for: Everyone born from 1945 through 1965. Anyone with known risk factors for hepatitis C. Sexually transmitted infections (STIs) You should be screened each year for STIs, including gonorrhea and chlamydia, if: You are sexually active and are younger than 51 years of age. You are older than 51 years of age and your   health care provider tells you that you are at risk for this type of infection. Your sexual activity has changed since you were last screened, and you are at increased risk for chlamydia or gonorrhea. Ask your health care provider if you are at risk. Ask your health care provider about whether you are at high risk for HIV. Your health care provider  may recommend a prescription medicine to help prevent HIV infection. If you choose to take medicine to prevent HIV, you should first get tested for HIV. You should then be tested every 3 months for as long as you are taking the medicine. Follow these instructions at home: Alcohol use Do not drink alcohol if your health care provider tells you not to drink. If you drink alcohol: Limit how much you have to 0-2 drinks a day. Know how much alcohol is in your drink. In the [place] one drink equals one 12 oz bottle of beer (355 mL), one 5 oz glass of wine (148 mL), or one 1 oz glass of hard liquor (44 mL). Lifestyle Do not use any products that contain nicotine or tobacco. These products include cigarettes, chewing tobacco, and vaping devices, such as e-cigarettes. If you need help quitting, ask your health care provider. Do not use street drugs. Do not share needles. Ask your health care provider for help if you need support or information about quitting drugs. General instructions Schedule regular health, dental, and eye exams. Stay current with your vaccines. Tell your health care provider if: You often feel depressed. You have ever been abused or do not feel safe at home. Summary Adopting a healthy lifestyle and getting preventive care are important in promoting health and wellness. Follow your health care provider's instructions about healthy diet, exercising, and getting tested or screened for diseases. Follow your health care provider's instructions on monitoring your cholesterol and blood pressure. This information is not intended to replace advice given to you by your health care provider. Make sure you discuss any questions you have with your health care provider. Document Revised: 09/19/2020 Document Reviewed: 09/19/2020 Elsevier Patient Education  2023 Elsevier Inc.  

## 2021-12-21 ENCOUNTER — Ambulatory Visit (HOSPITAL_BASED_OUTPATIENT_CLINIC_OR_DEPARTMENT_OTHER)
Admission: RE | Admit: 2021-12-21 | Discharge: 2021-12-21 | Disposition: A | Discharge status: Home / Self Care | Payer: BC Managed Care – PPO | Source: Ambulatory Visit | Attending: Internal Medicine | Admitting: Internal Medicine

## 2021-12-21 DIAGNOSIS — R0609 Other forms of dyspnea: Secondary | ICD-10-CM | POA: Insufficient documentation

## 2021-12-21 DIAGNOSIS — R9431 Abnormal electrocardiogram [ECG] [EKG]: Secondary | ICD-10-CM | POA: Insufficient documentation

## 2022-01-08 ENCOUNTER — Other Ambulatory Visit: Payer: Self-pay | Admitting: Internal Medicine

## 2022-01-08 DIAGNOSIS — I10 Essential (primary) hypertension: Secondary | ICD-10-CM

## 2022-04-07 ENCOUNTER — Other Ambulatory Visit: Payer: Self-pay | Admitting: Internal Medicine

## 2022-04-07 DIAGNOSIS — K2100 Gastro-esophageal reflux disease with esophagitis, without bleeding: Secondary | ICD-10-CM

## 2022-05-28 ENCOUNTER — Encounter: Payer: Self-pay | Admitting: Internal Medicine

## 2022-05-30 ENCOUNTER — Encounter: Payer: Self-pay | Admitting: Internal Medicine

## 2022-05-30 ENCOUNTER — Ambulatory Visit: Payer: BC Managed Care – PPO | Admitting: Internal Medicine

## 2022-05-30 ENCOUNTER — Ambulatory Visit (INDEPENDENT_AMBULATORY_CARE_PROVIDER_SITE_OTHER): Payer: BC Managed Care – PPO

## 2022-05-30 VITALS — BP 138/86 | HR 65 | Temp 98.1°F | Ht 66.0 in | Wt 239.0 lb

## 2022-05-30 DIAGNOSIS — R058 Other specified cough: Secondary | ICD-10-CM

## 2022-05-30 DIAGNOSIS — M67441 Ganglion, right hand: Secondary | ICD-10-CM | POA: Diagnosis not present

## 2022-05-30 DIAGNOSIS — J069 Acute upper respiratory infection, unspecified: Secondary | ICD-10-CM | POA: Insufficient documentation

## 2022-05-30 MED ORDER — HYDROCODONE BIT-HOMATROP MBR 5-1.5 MG/5ML PO SOLN: 5.0000 mL | Freq: Three times a day (TID) | ORAL | 0 refills | Status: DC | PRN

## 2022-05-30 NOTE — Progress Notes (Signed)
Subjective:  Patient ID: Jacob Revel., male    DOB: 02/07/1971  Age: 52 y.o. MRN: [id number]  CC: Cough   HPI Jacob Lazenby. presents for f/up - He complains of a 1 week history of cough productive of yellow phlegm.  His symptoms are gradually improving.  Outpatient Medications Prior to Visit  Medication Sig Dispense Refill   esomeprazole (NEXIUM) 40 MG capsule TAKE 1 CAPSULE BY MOUTH EVERY DAY 90 capsule 1   etodolac (LODINE XL) 400 MG 24 hr tablet TAKE 1 TABLET BY MOUTH EVERY DAY 90 tablet 0   indapamide (LOZOL) 1.25 MG tablet TAKE 1 TABLET BY MOUTH DAILY. 90 tablet 0   nebivolol (BYSTOLIC) 2.5 MG tablet TAKE 1 TABLET BY MOUTH EVERY DAY 90 tablet 1   tadalafil (CIALIS) 20 MG tablet TAKE 1 TABLET BY MOUTH EVERY DAY AS NEEDED 4 tablet 2   No facility-administered medications prior to visit.    ROS Review of Systems  Constitutional: Negative.  Negative for chills, fatigue and fever.  HENT: Negative.  Negative for sore throat and trouble swallowing.   Eyes: Negative.   Respiratory:  Positive for cough. Negative for chest tightness, shortness of breath and wheezing.   Cardiovascular:  Negative for chest pain, palpitations and leg swelling.  Gastrointestinal:  Negative for abdominal pain, diarrhea and nausea.  Endocrine: Negative.   Genitourinary: Negative.  Negative for difficulty urinating.  Musculoskeletal: Negative.        He is concerned about a painful growth on the palmar side, base of his right ring finger.  Skin: Negative.  Negative for rash.  Neurological: Negative.  Negative for dizziness and weakness.  Hematological:  Negative for adenopathy. Does not bruise/bleed easily.  Psychiatric/Behavioral: Negative.      Objective:  BP 138/86 (BP Location: Right Arm, Patient Position: Sitting, Cuff Size: Large)   Pulse 65   Temp 98.1 F (36.7 C) (Oral)   Ht 5\' 6"  (1.676 m)   Wt 239 lb (108.4 kg)   SpO2 95%   BMI 38.58 kg/m   BP Readings from Last 3  Encounters:  05/30/22 138/86  11/27/21 132/86  05/31/21 138/84    Wt Readings from Last 3 Encounters:  05/30/22 239 lb (108.4 kg)  11/27/21 238 lb (108 kg)  05/31/21 234 lb (106.1 kg)    Physical Exam Vitals reviewed.  HENT:     Mouth/Throat:     Mouth: Mucous membranes are moist.  Eyes:     General: No scleral icterus.    Conjunctiva/sclera: Conjunctivae normal.  Cardiovascular:     Rate and Rhythm: Normal rate and regular rhythm.     Heart sounds: No murmur heard. Pulmonary:     Effort: Pulmonary effort is normal.     Breath sounds: No stridor. No wheezing, rhonchi or rales.  Abdominal:     General: Abdomen is flat.     Palpations: There is no mass.     Tenderness: There is no abdominal tenderness. There is no guarding.     Hernia: No hernia is present.  Musculoskeletal:        General: Normal range of motion.     Cervical back: Neck supple.     Right lower leg: No edema.     Left lower leg: No edema.     Comments: Ganglion cyst, palmar side of right ring finger over the MCP joint.  Lymphadenopathy:     Cervical: No cervical adenopathy.  Skin:    General: Skin  is warm and dry.     Coloration: Skin is not pale.  Neurological:     General: No focal deficit present.     Mental Status: He is alert.  Psychiatric:        Mood and Affect: Mood normal.        Behavior: Behavior normal.     Lab Results  Component Value Date   WBC 9.4 05/31/2021   HGB 15.3 05/31/2021   HCT 46.9 05/31/2021   PLT 205.0 05/31/2021   GLUCOSE 87 11/27/2021   CHOL 188 11/27/2021   TRIG 149.0 11/27/2021   HDL 48.60 11/27/2021   LDLDIRECT 120.0 11/17/2015   LDLCALC 109 (H) 11/27/2021   ALT 27 11/23/2020   AST 18 11/23/2020   NA 142 11/27/2021   K 4.2 11/27/2021   CL 102 11/27/2021   CREATININE 1.17 11/27/2021   BUN 16 11/27/2021   CO2 29 11/27/2021   TSH 1.39 11/23/2020   PSA 1.35 11/27/2021    CT CARDIAC SCORING (SELF PAY ONLY)  Addendum Date: 12/21/2021   ADDENDUM  REPORT: 12/21/2021 17:55 CLINICAL DATA:  Cardiovascular Disease Risk stratification EXAM: Coronary Calcium Score TECHNIQUE: A gated, non-contrast computed tomography scan of the heart was performed using 21mm slice thickness. Axial images were analyzed on a dedicated workstation. Calcium scoring of the coronary arteries was performed using the Agatston method. FINDINGS: Coronary arteries: Normal origins. Coronary Calcium Score: Total: 0 Pericardium: Normal. Ascending Aorta: Normal caliber. Non-cardiac: See separate report from [facility]. IMPRESSION: Coronary calcium score of 0. RECOMMENDATIONS: Coronary artery calcium (CAC) score is a strong predictor of incident coronary heart disease (CHD) and provides predictive information beyond traditional risk factors. CAC scoring is reasonable to use in the decision to withhold, postpone, or initiate statin therapy in intermediate-risk or selected borderline-risk asymptomatic adults (age 24-75 years and LDL-C >=70 to <190 mg/dL) who do not have diabetes or established atherosclerotic cardiovascular disease (ASCVD).* In intermediate-risk (10-year ASCVD risk >=7.5% to <20%) adults or selected borderline-risk (10-year ASCVD risk >=5% to <7.5%) adults in whom a CAC score is measured for the purpose of making a treatment decision the following recommendations have been made: If CAC=0, it is reasonable to withhold statin therapy and reassess in 5 to 10 years, as long as higher risk conditions are absent (diabetes mellitus, family history of premature CHD in first degree relatives (males <55 years; females <65 years), cigarette smoking, or LDL >=190 mg/dL). If CAC is 1 to 99, it is reasonable to initiate statin therapy for patients >=15 years of age. If CAC is >=100 or >=75th percentile, it is reasonable to initiate statin therapy at any age. Cardiology referral should be considered for patients with CAC scores >=400 or >=75th percentile. *2018  AHA/ACC/AACVPR/AAPA/ABC/ACPM/ADA/AGS/APhA/ASPC/NLA/PCNA Guideline on the Management of Blood Cholesterol: A Report of the American College of Cardiology/American Heart Association Task Force on Clinical Practice Guidelines. J Am Coll Cardiol. 2019;73(24):3168-3209. Electronically Signed   By: Donato Schultz M.D.   On: 12/21/2021 17:55   Result Date: 12/21/2021 EXAM: OVER-READ INTERPRETATION  CT CHEST The following report is a limited chest CT over-read performed by radiologist Dr. Charlett Nose of [facility] on 12/21/2021. This over-read does not include interpretation of cardiac or coronary anatomy or pathology. The coronary calcium score interpretation by the cardiologist is attached. COMPARISON:  None Available. FINDINGS: Vascular: Heart is normal size.  Aorta normal caliber. Mediastinum/Nodes: No adenopathy Lungs/Pleura: No confluent opacities or effusions Upper Abdomen: No acute findings Musculoskeletal: Chest wall soft  tissues are unremarkable. No acute bony abnormality IMPRESSION: No acute or significant extracardiac abnormality. Electronically Signed: By: Rolm Baptise M.D. On: 12/21/2021 17:12   DG Chest 2 View  Result Date: 05/30/2022 CLINICAL DATA:  Productive cough for 1 week, former smoker EXAM: CHEST - 2 VIEW COMPARISON:  05/31/2021 FINDINGS: Upper normal heart size with slight pulmonary vascular congestion. Mediastinal contours normal. Lungs clear. No acute infiltrate, pleural effusion, or pneumothorax. Osseous structures unremarkable. IMPRESSION: No acute abnormalities. Electronically Signed   By: Lavonia Dana M.D.   On: 05/30/2022 11:16       Assessment & Plan:   Jacob Cross was seen today for cough.  Diagnoses and all orders for this visit:  Cough productive of yellow sputum- Chest x-ray is negative for mass or infiltrate.  Will treat for viral URI. -     DG Chest 2 View; Future -     chlorpheniramine-HYDROcodone (TUSSIONEX) 10-8 MG/5ML; Take 5 mLs by mouth every 12 (twelve) hours  as needed for cough.  Ganglion of flexor tendon sheath of right ring finger -     Ambulatory referral to Orthopedic Surgery  Viral upper respiratory tract infection -     Discontinue: HYDROcodone bit-homatropine (HYCODAN) 5-1.5 MG/5ML syrup; Take 5 mLs by mouth every 8 (eight) hours as needed for up to 8 days for cough. -     chlorpheniramine-HYDROcodone (TUSSIONEX) 10-8 MG/5ML; Take 5 mLs by mouth every 12 (twelve) hours as needed for cough.   I have discontinued Jacob G. Rohl Jr.'s HYDROcodone bit-homatropine. I am also having him start on chlorpheniramine-HYDROcodone. Additionally, I am having him maintain his indapamide, etodolac, tadalafil, nebivolol, and esomeprazole.  Meds ordered this encounter  Medications   DISCONTD: HYDROcodone bit-homatropine (HYCODAN) 5-1.5 MG/5ML syrup    Sig: Take 5 mLs by mouth every 8 (eight) hours as needed for up to 8 days for cough.    Dispense:  120 mL    Refill:  0   chlorpheniramine-HYDROcodone (TUSSIONEX) 10-8 MG/5ML    Sig: Take 5 mLs by mouth every 12 (twelve) hours as needed for cough.    Dispense:  70 mL    Refill:  0     Follow-up: Return in about 3 months (around 08/29/2022).  Scarlette Calico, MD

## 2022-05-30 NOTE — Patient Instructions (Signed)

## 2022-05-31 ENCOUNTER — Other Ambulatory Visit: Payer: Self-pay | Admitting: Internal Medicine

## 2022-05-31 DIAGNOSIS — J069 Acute upper respiratory infection, unspecified: Secondary | ICD-10-CM

## 2022-05-31 MED ORDER — HYDROCOD POLI-CHLORPHE POLI ER 10-8 MG/5ML PO SUER: 5.0000 mL | Freq: Two times a day (BID) | ORAL | 0 refills | Status: DC | PRN

## 2022-06-15 ENCOUNTER — Ambulatory Visit (INDEPENDENT_AMBULATORY_CARE_PROVIDER_SITE_OTHER): Payer: BC Managed Care – PPO | Admitting: Orthopaedic Surgery

## 2022-06-15 DIAGNOSIS — M67441 Ganglion, right hand: Secondary | ICD-10-CM | POA: Diagnosis not present

## 2022-06-15 NOTE — Progress Notes (Signed)
Office Visit Note   Patient: Jacob Cross.           Date of Birth: 1971-05-04           MRN: [id number] Visit Date: 06/15/2022              Requested by: Janith Lima, MD [address] [place],  [place] [address] PCP: Janith Lima, MD   Assessment & Plan: Visit Diagnoses:  1. Ganglion cyst of flexor tendon sheath of finger of right hand     Plan: Impression is right ring finger ganglion cyst of flexor tendon sheath.  Disease process and condition reviewed with the patient and treatment options were reviewed.  For now he would like to just live with it and keep an eye on it.  Will see him back as needed.  Follow-Up Instructions: No follow-ups on file.   Orders:  No orders of the defined types were placed in this encounter.  No orders of the defined types were placed in this encounter.     Procedures: No procedures performed   Clinical Data: No additional findings.   Subjective: Chief Complaint  Patient presents with   Right Hand - Pain    HPI Jacob Cross is a 52 year old gentleman here for painful cyst in his right ring finger.  Originally it was quite bothersome but now the size has decreased.  Denies any particular injuries.  It was originally sore.  Feels like it flares up periodically.  First noticed it about a month ago.  Right-hand-dominant. Review of Systems  Constitutional: Negative.   HENT: Negative.    Eyes: Negative.   Respiratory: Negative.    Cardiovascular: Negative.   Gastrointestinal: Negative.   Endocrine: Negative.   Genitourinary: Negative.   Skin: Negative.   Allergic/Immunologic: Negative.   Neurological: Negative.   Hematological: Negative.   Psychiatric/Behavioral: Negative.    All other systems reviewed and are negative.    Objective: Vital Signs: There were no vitals taken for this visit.  Physical Exam Vitals and nursing note reviewed.  Constitutional:      Appearance: He is well-developed.  HENT:     Head:  Normocephalic and atraumatic.  Eyes:     Pupils: Pupils are equal, round, and reactive to light.  Pulmonary:     Effort: Pulmonary effort is normal.  Abdominal:     Palpations: Abdomen is soft.  Musculoskeletal:        General: Normal range of motion.     Cervical back: Neck supple.  Skin:    General: Skin is warm.  Neurological:     Mental Status: He is alert and oriented to person, place, and time.  Psychiatric:        Behavior: Behavior normal.        Thought Content: Thought content normal.        Judgment: Judgment normal.     Ortho Exam Examination of right hand shows a firm mass that is slightly tender in the finger palmar flexion crease of the ring finger.  He can make a full composite fist.  No neurovascular compromise to the finger.  There is no triggering. Specialty Comments:  No specialty comments available.  Imaging: No results found.   PMFS History: Patient Active Problem List   Diagnosis Date Noted   Cough productive of yellow sputum 05/30/2022   Ganglion of flexor tendon sheath of right ring finger 05/30/2022   Viral upper respiratory tract infection 05/30/2022   Abnormal  electrocardiogram (ECG) (EKG) 11/27/2021   DOE (dyspnea on exertion) 11/27/2021   Spinal stenosis of lumbar region with neurogenic claudication 03/21/2021   Chronic left-sided low back pain with left-sided sciatica 02/23/2021   Herpes labialis 07/25/2020   Episodic circadian rhythm sleep disorder, shift work type 07/04/2017   Poor compliance with CPAP treatment 07/04/2017   Class 2 severe obesity due to excess calories with serious comorbidity and body mass index (BMI) of 35.0 to 35.9 in adult [facility]) 07/04/2017   Erectile dysfunction 08/28/2013   Essential hypertension, benign 05/29/2013   OSA (obstructive sleep apnea) 02/26/2013   Routine general medical examination at a health care facility 06/15/2011   Allergic rhinitis 10/06/2009   GERD 10/06/2009   BPH (benign prostatic  hyperplasia) 10/06/2009   Past Medical History:  Diagnosis Date   Allergy    Chest pain, atypical    GERD (gastroesophageal reflux disease)    HYPERTROPHY PROSTATE W/UR OBST & OTH LUTS     Family History  Problem Relation Age of Onset   Hypertension Mother    Diabetes Father    Colon cancer Other        1st degree relative <60   Diabetes Other        1st degree relative   Hypertension Other    Stroke Other        Male 1st degree relative <50    Past Surgical History:  Procedure Laterality Date   HERNIA REPAIR     TONSILLECTOMY     Social History   Occupational History   Occupation: Producer, television/film/video  Tobacco Use   Smoking status: Former    Packs/day: 0.50    Years: 10.00    Total pack years: 5.00    Types: Cigarettes    Quit date: 09/11/2009    Years since quitting: 12.7   Smokeless tobacco: Never  Substance and Sexual Activity   Alcohol use: Yes    Comment: Occasional   Drug use: No   Sexual activity: Yes    Birth control/protection: Pill

## 2022-07-01 IMAGING — DX DG CHEST 2V
3 series · 3 of 3 positions shown · non-contrast
Comparison: Chest x-ray 04/30/2019

CLINICAL DATA: Cough

EXAM:
CHEST - 2 VIEW

[chest pa (1 of 2)]
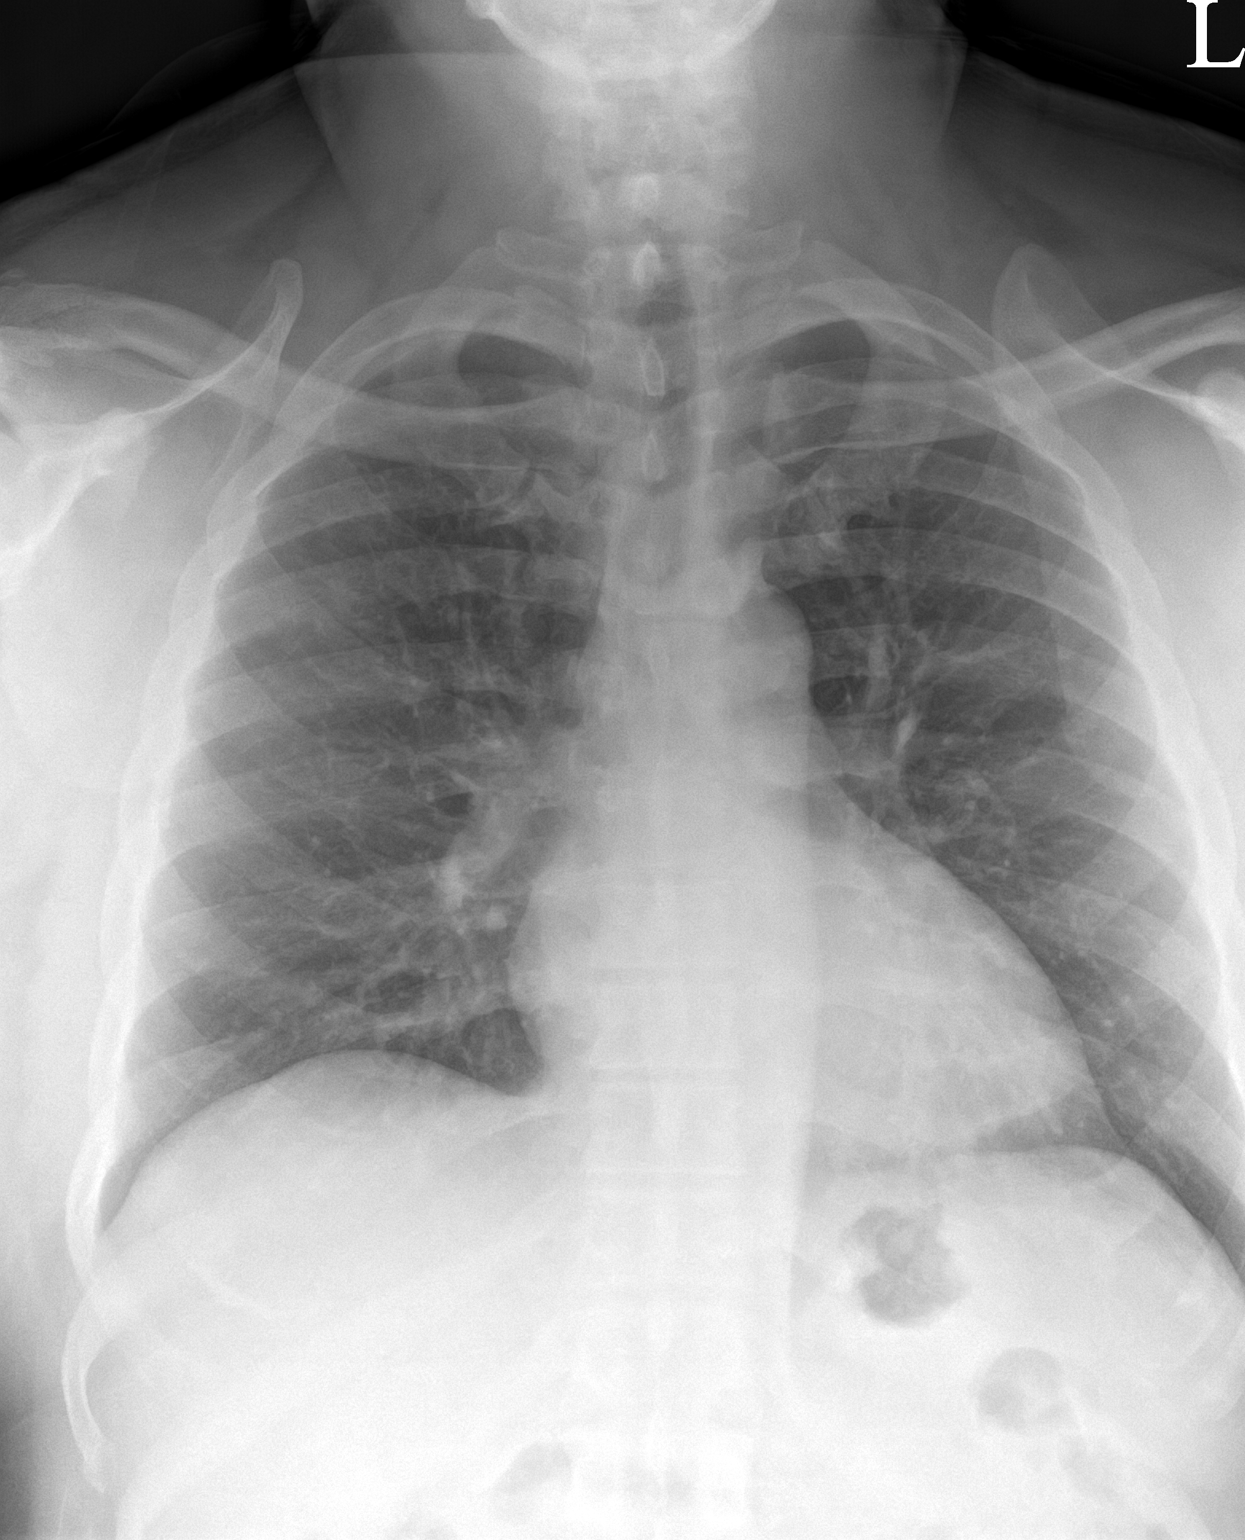

[chest lat]
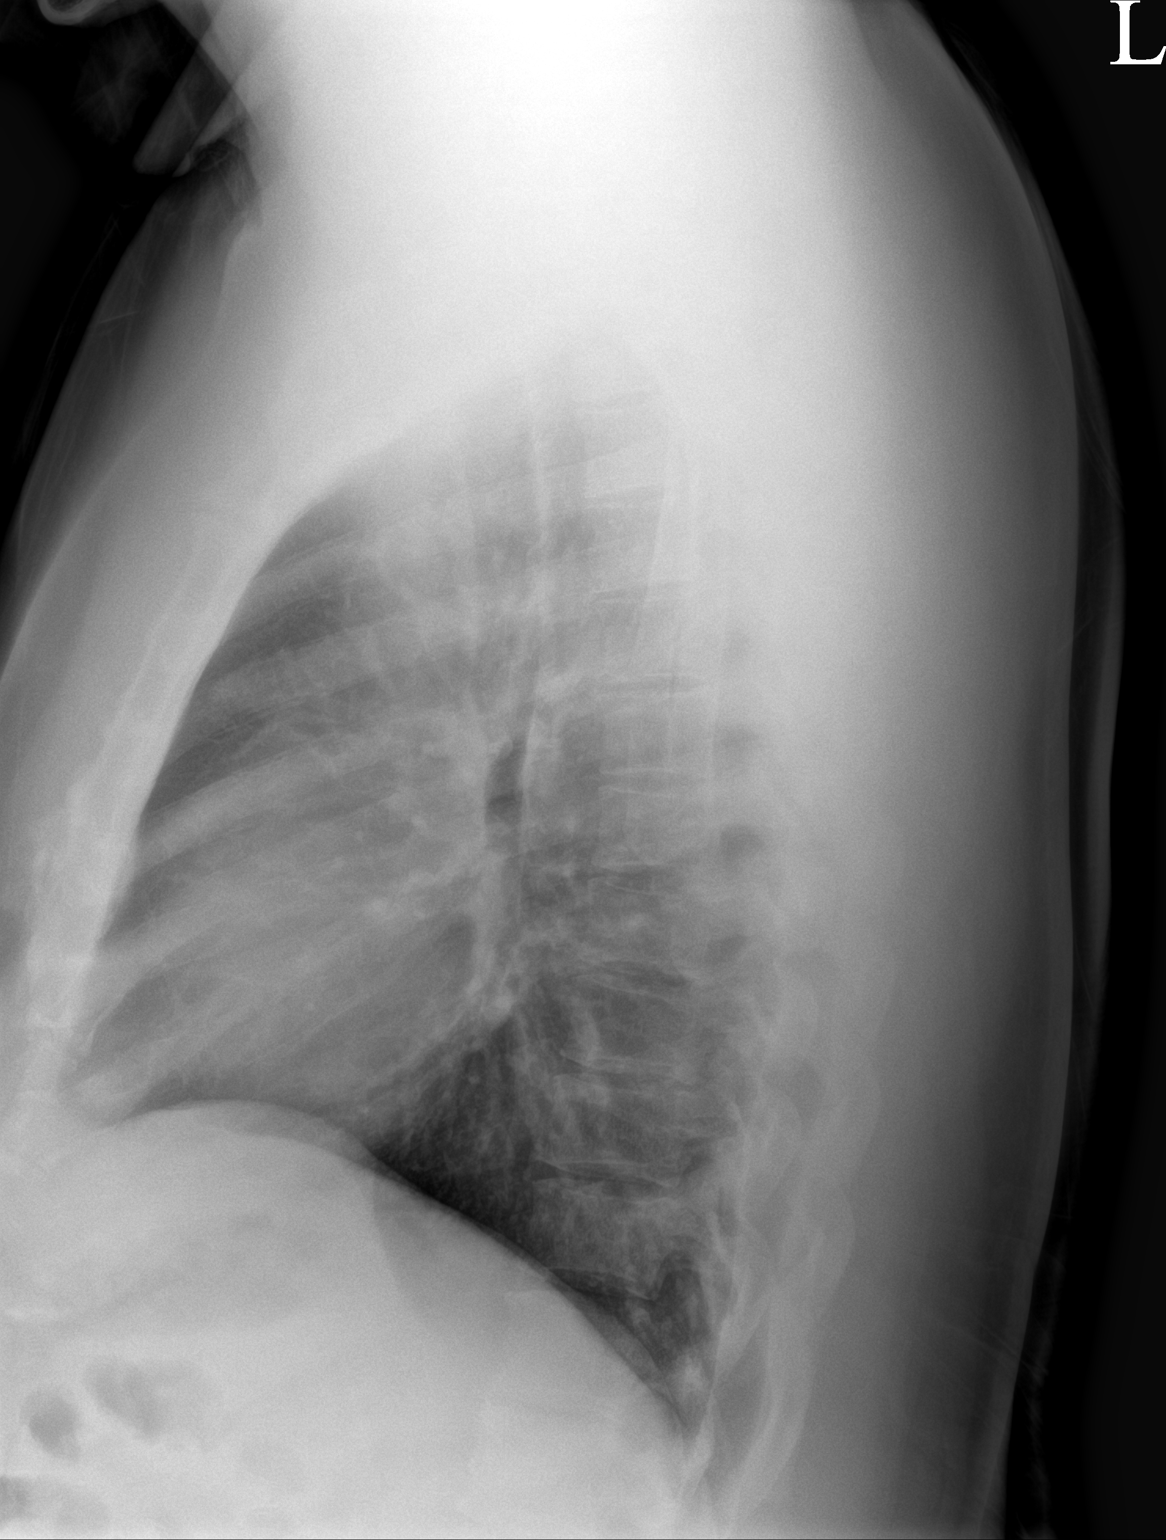

[chest pa (2 of 2)]
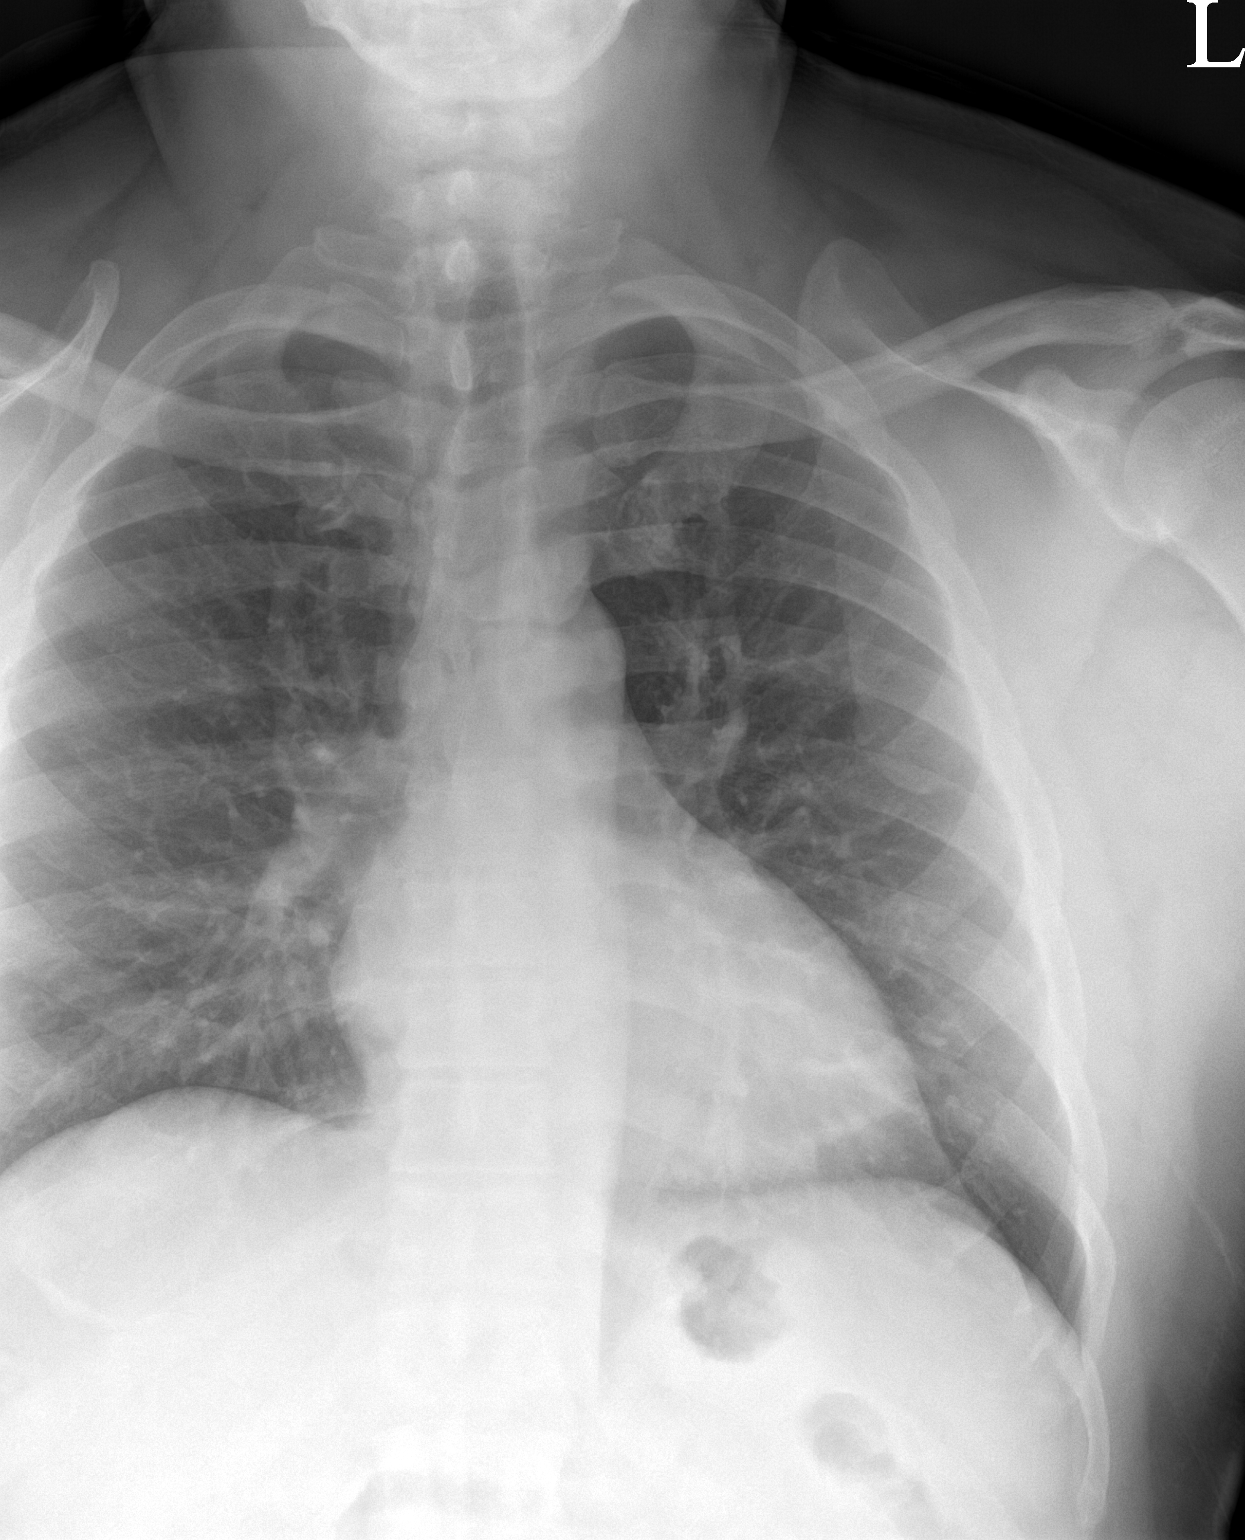

[3 of 3 positions shown; findings below may reference images not displayed]

FINDINGS: Heart size and mediastinal contours are within normal limits. No
suspicious pulmonary opacities identified.

No pleural effusion or pneumothorax visualized.

No acute osseous abnormality appreciated.
IMPRESSION: No acute intrathoracic process identified.

## 2022-07-04 ENCOUNTER — Other Ambulatory Visit: Payer: Self-pay | Admitting: Internal Medicine

## 2022-07-04 DIAGNOSIS — I10 Essential (primary) hypertension: Secondary | ICD-10-CM

## 2022-10-06 ENCOUNTER — Other Ambulatory Visit: Payer: Self-pay | Admitting: Internal Medicine

## 2022-10-06 DIAGNOSIS — K2100 Gastro-esophageal reflux disease with esophagitis, without bleeding: Secondary | ICD-10-CM

## 2022-11-28 LAB — HM DIABETES EYE EXAM

## 2022-12-03 ENCOUNTER — Ambulatory Visit (INDEPENDENT_AMBULATORY_CARE_PROVIDER_SITE_OTHER): Payer: BC Managed Care – PPO | Admitting: Internal Medicine

## 2022-12-03 ENCOUNTER — Encounter: Payer: Self-pay | Admitting: Internal Medicine

## 2022-12-03 VITALS — BP 148/88 | HR 78 | Temp 98.4°F | Resp 16 | Ht 66.0 in | Wt 241.0 lb

## 2022-12-03 DIAGNOSIS — E785 Hyperlipidemia, unspecified: Secondary | ICD-10-CM

## 2022-12-03 DIAGNOSIS — E66812 Obesity, class 2: Secondary | ICD-10-CM

## 2022-12-03 DIAGNOSIS — N400 Enlarged prostate without lower urinary tract symptoms: Secondary | ICD-10-CM

## 2022-12-03 DIAGNOSIS — K2100 Gastro-esophageal reflux disease with esophagitis, without bleeding: Secondary | ICD-10-CM | POA: Diagnosis not present

## 2022-12-03 DIAGNOSIS — G4733 Obstructive sleep apnea (adult) (pediatric): Secondary | ICD-10-CM

## 2022-12-03 DIAGNOSIS — Z0001 Encounter for general adult medical examination with abnormal findings: Secondary | ICD-10-CM

## 2022-12-03 DIAGNOSIS — I119 Hypertensive heart disease without heart failure: Secondary | ICD-10-CM

## 2022-12-03 DIAGNOSIS — I10 Essential (primary) hypertension: Secondary | ICD-10-CM | POA: Diagnosis not present

## 2022-12-03 DIAGNOSIS — Z6835 Body mass index (BMI) 35.0-35.9, adult: Secondary | ICD-10-CM

## 2022-12-03 LAB — URINALYSIS, ROUTINE W REFLEX MICROSCOPIC
Bilirubin Urine: NEGATIVE
Hgb urine dipstick: NEGATIVE
Ketones, ur: NEGATIVE
Leukocytes,Ua: NEGATIVE
Nitrite: NEGATIVE
RBC / HPF: NONE SEEN (ref 0–?)
Specific Gravity, Urine: 1.02 (ref 1.000–1.030)
Total Protein, Urine: NEGATIVE
Urine Glucose: NEGATIVE
Urobilinogen, UA: 1 (ref 0.0–1.0)
WBC, UA: NONE SEEN (ref 0–?)
pH: 7 (ref 5.0–8.0)

## 2022-12-03 LAB — BASIC METABOLIC PANEL
BUN: 14 mg/dL (ref 6–23)
CO2: 29 mEq/L (ref 19–32)
Calcium: 9.5 mg/dL (ref 8.4–10.5)
Chloride: 99 mEq/L (ref 96–112)
Creatinine, Ser: 1.23 mg/dL (ref 0.40–1.50)
GFR: 67.69 mL/min (ref >60.00)
Glucose, Bld: 125 mg/dL — ABNORMAL HIGH (ref 70–99)
Potassium: 4.5 mEq/L (ref 3.5–5.1)
Sodium: 139 mEq/L (ref 135–145)

## 2022-12-03 LAB — CBC WITH DIFFERENTIAL/PLATELET
Basophils Absolute: 0.1 10*3/uL (ref 0.0–0.1)
Basophils Relative: 0.5 % (ref 0.0–3.0)
Eosinophils Absolute: 0.1 10*3/uL (ref 0.0–0.7)
Eosinophils Relative: 1.2 % (ref 0.0–5.0)
HCT: 49.5 % (ref 39.0–52.0)
Hemoglobin: 15.8 g/dL (ref 13.0–17.0)
Lymphocytes Relative: 21.1 % (ref 12.0–46.0)
Lymphs Abs: 2.4 10*3/uL (ref 0.7–4.0)
MCHC: 31.9 g/dL (ref 30.0–36.0)
MCV: 90.8 fl (ref 78.0–100.0)
Monocytes Absolute: 1 10*3/uL (ref 0.1–1.0)
Monocytes Relative: 8.8 % (ref 3.0–12.0)
Neutro Abs: 7.8 10*3/uL — ABNORMAL HIGH (ref 1.4–7.7)
Neutrophils Relative %: 68.4 % (ref 43.0–77.0)
Platelets: 202 10*3/uL (ref 150.0–400.0)
RBC: 5.45 Mil/uL (ref 4.22–5.81)
RDW: 14.9 % (ref 11.5–15.5)
WBC: 11.4 10*3/uL — ABNORMAL HIGH (ref 4.0–10.5)

## 2022-12-03 LAB — HEPATIC FUNCTION PANEL
ALT: 33 U/L (ref 0–53)
AST: 21 U/L (ref 0–37)
Albumin: 4.6 g/dL (ref 3.5–5.2)
Alkaline Phosphatase: 74 U/L (ref 39–117)
Bilirubin, Direct: 0.1 mg/dL (ref 0.0–0.3)
Total Bilirubin: 0.6 mg/dL (ref 0.2–1.2)
Total Protein: 7.1 g/dL (ref 6.0–8.3)

## 2022-12-03 LAB — TSH: TSH: 1.48 u[IU]/mL (ref 0.35–5.50)

## 2022-12-03 LAB — PSA: PSA: 0.46 ng/mL (ref 0.10–4.00)

## 2022-12-03 MED ORDER — INDAPAMIDE 1.25 MG PO TABS
1.2500 mg | ORAL_TABLET | Freq: Every day | ORAL | 0 refills | Status: DC
Start: 2022-12-03 — End: 2023-03-01

## 2022-12-03 NOTE — Patient Instructions (Signed)
Hypertension, Adult High blood pressure (hypertension) is when the force of blood pumping through the arteries is too strong. The arteries are the blood vessels that carry blood from the heart throughout the body. Hypertension forces the heart to work harder to pump blood and may cause arteries to become narrow or stiff. Untreated or uncontrolled hypertension can lead to a heart attack, heart failure, a stroke, kidney disease, and other problems. A blood pressure reading consists of a higher number over a lower number. Ideally, your blood pressure should be below 120/80. The first ("top") number is called the systolic pressure. It is a measure of the pressure in your arteries as your heart beats. The second ("bottom") number is called the diastolic pressure. It is a measure of the pressure in your arteries as the heart relaxes. What are the causes? The exact cause of this condition is not known. There are some conditions that result in high blood pressure. What increases the risk? Certain factors may make you more likely to develop high blood pressure. Some of these risk factors are under your control, including: Smoking. Not getting enough exercise or physical activity. Being overweight. Having too much fat, sugar, calories, or salt (sodium) in your diet. Drinking too much alcohol. Other risk factors include: Having a personal history of heart disease, diabetes, high cholesterol, or kidney disease. Stress. Having a family history of high blood pressure and high cholesterol. Having obstructive sleep apnea. Age. The risk increases with age. What are the signs or symptoms? High blood pressure may not cause symptoms. Very high blood pressure (hypertensive crisis) may cause: Headache. Fast or irregular heartbeats (palpitations). Shortness of breath. Nosebleed. Nausea and vomiting. Vision changes. Severe chest pain, dizziness, and seizures. How is this diagnosed? This condition is diagnosed by  measuring your blood pressure while you are seated, with your arm resting on a flat surface, your legs uncrossed, and your feet flat on the floor. The cuff of the blood pressure monitor will be placed directly against the skin of your upper arm at the level of your heart. Blood pressure should be measured at least twice using the same arm. Certain conditions can cause a difference in blood pressure between your right and left arms. If you have a high blood pressure reading during one visit or you have normal blood pressure with other risk factors, you may be asked to: Return on a different day to have your blood pressure checked again. Monitor your blood pressure at home for 1 week or longer. If you are diagnosed with hypertension, you may have other blood or imaging tests to help your health care provider understand your overall risk for other conditions. How is this treated? This condition is treated by making healthy lifestyle changes, such as eating healthy foods, exercising more, and reducing your alcohol intake. You may be referred for counseling on a healthy diet and physical activity. Your health care provider may prescribe medicine if lifestyle changes are not enough to get your blood pressure under control and if: Your systolic blood pressure is above 130. Your diastolic blood pressure is above 80. Your personal target blood pressure may vary depending on your medical conditions, your age, and other factors. Follow these instructions at home: Eating and drinking  Eat a diet that is high in fiber and potassium, and low in sodium, added sugar, and fat. An example of this eating plan is called the DASH diet. DASH stands for Dietary Approaches to Stop Hypertension. To eat this way: Eat   plenty of fresh fruits and vegetables. Try to fill one half of your plate at each meal with fruits and vegetables. Eat whole grains, such as whole-wheat pasta, brown rice, or whole-grain bread. Fill about one  fourth of your plate with whole grains. Eat or drink low-fat dairy products, such as skim milk or low-fat yogurt. Avoid fatty cuts of meat, processed or cured meats, and poultry with skin. Fill about one fourth of your plate with lean proteins, such as fish, chicken without skin, beans, eggs, or tofu. Avoid pre-made and processed foods. These tend to be higher in sodium, added sugar, and fat. Reduce your daily sodium intake. Many people with hypertension should eat less than 1,500 mg of sodium a day. Do not drink alcohol if: Your health care provider tells you not to drink. You are pregnant, may be pregnant, or are planning to become pregnant. If you drink alcohol: Limit how much you have to: 0-1 drink a day for women. 0-2 drinks a day for men. Know how much alcohol is in your drink. In the [place] one drink equals one 12 oz bottle of beer (355 mL), one 5 oz glass of wine (148 mL), or one 1 oz glass of hard liquor (44 mL). Lifestyle  Work with your health care provider to maintain a healthy body weight or to lose weight. Ask what an ideal weight is for you. Get at least 30 minutes of exercise that causes your heart to beat faster (aerobic exercise) most days of the week. Activities may include walking, swimming, or biking. Include exercise to strengthen your muscles (resistance exercise), such as Pilates or lifting weights, as part of your weekly exercise routine. Try to do these types of exercises for 30 minutes at least 3 days a week. Do not use any products that contain nicotine or tobacco. These products include cigarettes, chewing tobacco, and vaping devices, such as e-cigarettes. If you need help quitting, ask your health care provider. Monitor your blood pressure at home as told by your health care provider. Keep all follow-up visits. This is important. Medicines Take over-the-counter and prescription medicines only as told by your health care provider. Follow directions carefully. Blood  pressure medicines must be taken as prescribed. Do not skip doses of blood pressure medicine. Doing this puts you at risk for problems and can make the medicine less effective. Ask your health care provider about side effects or reactions to medicines that you should watch for. Contact a health care provider if you: Think you are having a reaction to a medicine you are taking. Have headaches that keep coming back (recurring). Feel dizzy. Have swelling in your ankles. Have trouble with your vision. Get help right away if you: Develop a severe headache or confusion. Have unusual weakness or numbness. Feel faint. Have severe pain in your chest or abdomen. Vomit repeatedly. Have trouble breathing. These symptoms may be an emergency. Get help right away. Call 911. Do not wait to see if the symptoms will go away. Do not drive yourself to the hospital. Summary Hypertension is when the force of blood pumping through your arteries is too strong. If this condition is not controlled, it may put you at risk for serious complications. Your personal target blood pressure may vary depending on your medical conditions, your age, and other factors. For most people, a normal blood pressure is less than 120/80. Hypertension is treated with lifestyle changes, medicines, or a combination of both. Lifestyle changes include losing weight, eating a healthy,   low-sodium diet, exercising more, and limiting alcohol. This information is not intended to replace advice given to you by your health care provider. Make sure you discuss any questions you have with your health care provider. Document Revised: 03/07/2021 Document Reviewed: 03/07/2021 Elsevier Patient Education  2024 Elsevier Inc.  

## 2022-12-03 NOTE — Progress Notes (Signed)
Subjective:  Patient ID: Jacob Posey., male    DOB: [birth year]  Age: 52 y.o. MRN: [id number]  CC: Hypertension, Hyperlipidemia, Annual Exam, and Gastroesophageal Reflux   HPI Jacob Cross. presents for a CPX nd f/up ----  Discussed the use of AI scribe software for clinical note transcription with the patient, who gave verbal consent to proceed.  History of Present Illness   The patient, with a history of hypertension, presents for a routine check-up. They report feeling well overall, with no complaints of headache, blurred vision, chest pain, shortness of breath, or dizziness. They have been taking bystolic for blood pressure management, but do not regularly monitor their blood pressure at home.  The patient's physical activity primarily consists of walking at work and playing pool, with no reported exertional symptoms. They deny any recent cough or pain, occasionally taking Tylenol as needed. They also take a generic form of Nexium for heartburn and indigestion, with no reported dysphagia or odynophagia.  The patient has a history of sleep apnea, but is not currently using a CPAP machine, finding it disruptive to their sleep. Instead, they have been using ear buds, which they report improves their sleep quality. They have previously seen a specialist for their sleep apnea and have undergone a home sleep study.  The patient's weight was recorded as 241 pounds, indicating a recent weight gain. They have no interest in receiving a shingles vaccine and have a history of a normal colonoscopy conducted a year ago. The family history of colon or prostate cancer is unclear.       Outpatient Medications Prior to Visit  Medication Sig Dispense Refill   esomeprazole (NEXIUM) 40 MG capsule TAKE 1 CAPSULE BY MOUTH EVERY DAY 90 capsule 1   nebivolol (BYSTOLIC) 2.5 MG tablet TAKE 1 TABLET BY MOUTH EVERY DAY 90 tablet 1   tadalafil (CIALIS) 20 MG tablet TAKE 1 TABLET BY MOUTH EVERY DAY AS  NEEDED 4 tablet 2   chlorpheniramine-HYDROcodone (TUSSIONEX) 10-8 MG/5ML Take 5 mLs by mouth every 12 (twelve) hours as needed for cough. (Patient not taking: Reported on 12/03/2022) 70 mL 0   etodolac (LODINE XL) 400 MG 24 hr tablet TAKE 1 TABLET BY MOUTH EVERY DAY (Patient not taking: Reported on 12/03/2022) 90 tablet 0   indapamide (LOZOL) 1.25 MG tablet TAKE 1 TABLET BY MOUTH DAILY. (Patient not taking: Reported on 12/03/2022) 90 tablet 0   No facility-administered medications prior to visit.    ROS Review of Systems  Constitutional:  Positive for unexpected weight change. Negative for appetite change, chills, diaphoresis and fatigue.  HENT: Negative.  Negative for trouble swallowing.   Eyes: Negative.   Respiratory:  Positive for apnea. Negative for cough and shortness of breath.   Cardiovascular:  Negative for chest pain, palpitations and leg swelling.  Gastrointestinal:  Negative for abdominal pain, blood in stool, constipation, diarrhea, nausea and vomiting.  Endocrine: Negative.   Genitourinary: Negative.  Negative for difficulty urinating.  Musculoskeletal: Negative.  Negative for arthralgias and myalgias.  Skin: Negative.  Negative for color change and rash.  Neurological: Negative.  Negative for dizziness, tremors, light-headedness and headaches.  Hematological: Negative.  Negative for adenopathy. Does not bruise/bleed easily.  Psychiatric/Behavioral: Negative.      Objective:  BP (!) 148/88   Pulse 78   Temp 98.4 F (36.9 C) (Oral)   Resp 16   Ht 5\' 6"  (1.676 m)   Wt 241 lb (109.3 kg)   SpO2 96%  BMI 38.90 kg/m   BP Readings from Last 3 Encounters:  12/03/22 (!) 148/88  05/30/22 138/86  11/27/21 132/86    Wt Readings from Last 3 Encounters:  12/03/22 241 lb (109.3 kg)  05/30/22 239 lb (108.4 kg)  11/27/21 238 lb (108 kg)    Physical Exam Vitals reviewed.  Constitutional:      Appearance: Normal appearance.  HENT:     Mouth/Throat:     Mouth: Mucous  membranes are moist.  Eyes:     General: No scleral icterus.    Conjunctiva/sclera: Conjunctivae normal.  Cardiovascular:     Rate and Rhythm: Normal rate and regular rhythm.     Heart sounds: Normal heart sounds, S1 normal and S2 normal. No murmur heard.    No gallop.     Comments: EKG- NSR, 63 bpm ? LAE and LVH No Q waves  TWI in V1 and III is old Unchanged Pulmonary:     Breath sounds: No stridor. No wheezing, rhonchi or rales.  Abdominal:     General: Abdomen is protuberant. Bowel sounds are normal. There is no distension.     Palpations: Abdomen is soft. There is no hepatomegaly, splenomegaly or mass.     Tenderness: There is no abdominal tenderness. There is no guarding or rebound.     Hernia: No hernia is present. There is no hernia in the left inguinal area or right inguinal area.  Genitourinary:    Pubic Area: No rash.      Penis: Normal and circumcised.      Testes: Normal.     Epididymis:     Right: Normal.     Left: Normal.     Prostate: Enlarged. Not tender and no nodules present.     Rectum: Normal. Guaiac result negative. No mass, tenderness, anal fissure, external hemorrhoid or internal hemorrhoid. Normal anal tone.  Musculoskeletal:     Right lower leg: No edema.     Left lower leg: No edema.  Lymphadenopathy:     Cervical: No cervical adenopathy.     Lower Body: No right inguinal adenopathy. No left inguinal adenopathy.  Skin:    General: Skin is warm and dry.  Neurological:     General: No focal deficit present.     Mental Status: He is alert. Mental status is at baseline.  Psychiatric:        Mood and Affect: Mood normal.        Behavior: Behavior normal.     Lab Results  Component Value Date   WBC 11.4 (H) 12/03/2022   HGB 15.8 12/03/2022   HCT 49.5 12/03/2022   PLT 202.0 12/03/2022   GLUCOSE 125 (H) 12/03/2022   CHOL 188 11/27/2021   TRIG 149.0 11/27/2021   HDL 48.60 11/27/2021   LDLDIRECT 120.0 11/17/2015   LDLCALC 109 (H) 11/27/2021    ALT 33 12/03/2022   AST 21 12/03/2022   NA 139 12/03/2022   K 4.5 12/03/2022   CL 99 12/03/2022   CREATININE 1.23 12/03/2022   BUN 14 12/03/2022   CO2 29 12/03/2022   TSH 1.48 12/03/2022   PSA 0.46 12/03/2022    CT CARDIAC SCORING (SELF PAY ONLY)  Addendum Date: 12/21/2021   ADDENDUM REPORT: 12/21/2021 17:55 CLINICAL DATA:  Cardiovascular Disease Risk stratification EXAM: Coronary Calcium Score TECHNIQUE: A gated, non-contrast computed tomography scan of the heart was performed using 3mm slice thickness. Axial images were analyzed on a dedicated workstation. Calcium scoring of the coronary arteries was performed  using the Agatston method. FINDINGS: Coronary arteries: Normal origins. Coronary Calcium Score: Total: 0 Pericardium: Normal. Ascending Aorta: Normal caliber. Non-cardiac: See separate report from [facility]. IMPRESSION: Coronary calcium score of 0. RECOMMENDATIONS: Coronary artery calcium (CAC) score is a strong predictor of incident coronary heart disease (CHD) and provides predictive information beyond traditional risk factors. CAC scoring is reasonable to use in the decision to withhold, postpone, or initiate statin therapy in intermediate-risk or selected borderline-risk asymptomatic adults (age 60-75 years and LDL-C >=70 to <190 mg/dL) who do not have diabetes or established atherosclerotic cardiovascular disease (ASCVD).* In intermediate-risk (10-year ASCVD risk >=7.5% to <20%) adults or selected borderline-risk (10-year ASCVD risk >=5% to <7.5%) adults in whom a CAC score is measured for the purpose of making a treatment decision the following recommendations have been made: If CAC=0, it is reasonable to withhold statin therapy and reassess in 5 to 10 years, as long as higher risk conditions are absent (diabetes mellitus, family history of premature CHD in first degree relatives (males <55 years; females <65 years), cigarette smoking, or LDL >=190 mg/dL). If CAC is 1 to 99, it  is reasonable to initiate statin therapy for patients >=84 years of age. If CAC is >=100 or >=75th percentile, it is reasonable to initiate statin therapy at any age. Cardiology referral should be considered for patients with CAC scores >=400 or >=75th percentile. *2018 AHA/ACC/AACVPR/AAPA/ABC/ACPM/ADA/AGS/APhA/ASPC/NLA/PCNA Guideline on the Management of Blood Cholesterol: A Report of the American College of Cardiology/American Heart Association Task Force on Clinical Practice Guidelines. J Am Coll Cardiol. 2019;73(24):3168-3209. Electronically Signed   By: Donato Schultz M.D.   On: 12/21/2021 17:55   Result Date: 12/21/2021 EXAM: OVER-READ INTERPRETATION  CT CHEST The following report is a limited chest CT over-read performed by radiologist Dr. Charlett Nose of [facility] on 12/21/2021. This over-read does not include interpretation of cardiac or coronary anatomy or pathology. The coronary calcium score interpretation by the cardiologist is attached. COMPARISON:  None Available. FINDINGS: Vascular: Heart is normal size.  Aorta normal caliber. Mediastinum/Nodes: No adenopathy Lungs/Pleura: No confluent opacities or effusions Upper Abdomen: No acute findings Musculoskeletal: Chest wall soft tissues are unremarkable. No acute bony abnormality IMPRESSION: No acute or significant extracardiac abnormality. Electronically Signed: By: Charlett Nose M.D. On: 12/21/2021 17:12    Assessment & Plan:  OSA (obstructive sleep apnea) -     Ambulatory referral to Sleep Studies  Encounter for general adult medical examination with abnormal findings- Exam completed, labs reviewed, vaccines reviewed and updated, cancer screenings addressed, pt ed material was given.  -     PSA; Future  Gastroesophageal reflux disease with esophagitis without hemorrhage -     CBC with Differential/Platelet; Future  Hypertensive left ventricular hypertrophy, without heart failure- He has LVH and his blood pressure is not  well-controlled.  Will restart indapamide and continue nebivolol. -     EKG 12-Lead -     Hepatic function panel; Future -     Basic metabolic panel; Future -     CBC with Differential/Platelet; Future -     Indapamide; Take 1 tablet (1.25 mg total) by mouth daily.  Dispense: 90 tablet; Refill: 0  Essential hypertension, benign- His BP is not at goal. -     EKG 12-Lead -     Basic metabolic panel; Future -     CBC with Differential/Platelet; Future -     Indapamide; Take 1 tablet (1.25 mg total) by mouth daily.  Dispense: 90 tablet;  Refill: 0  Benign prostatic hyperplasia without lower urinary tract symptoms -     Urinalysis, Routine w reflex microscopic; Future  Class 2 severe obesity due to excess calories with serious comorbidity and body mass index (BMI) of 35.0 to 35.9 in adult (HCC) -     TSH; Future -     Hepatic function panel; Future -     Basic metabolic panel; Future     Follow-up: Return in about 3 months (around 03/05/2023).  Sanda Linger, MD

## 2022-12-08 ENCOUNTER — Encounter: Payer: Self-pay | Admitting: Internal Medicine

## 2022-12-09 DIAGNOSIS — E785 Hyperlipidemia, unspecified: Secondary | ICD-10-CM | POA: Insufficient documentation

## 2022-12-09 DIAGNOSIS — I119 Hypertensive heart disease without heart failure: Secondary | ICD-10-CM | POA: Insufficient documentation

## 2022-12-09 MED ORDER — ROSUVASTATIN CALCIUM 5 MG PO TABS
5.0000 mg | ORAL_TABLET | Freq: Every day | ORAL | 1 refills | Status: DC
Start: 2022-12-09 — End: 2023-06-09

## 2022-12-12 ENCOUNTER — Telehealth: Payer: BC Managed Care – PPO | Admitting: Nurse Practitioner

## 2022-12-12 DIAGNOSIS — J069 Acute upper respiratory infection, unspecified: Secondary | ICD-10-CM

## 2022-12-12 MED ORDER — BENZONATATE 100 MG PO CAPS: 100.0000 mg | ORAL_CAPSULE | Freq: Three times a day (TID) | ORAL | 0 refills | Status: DC | PRN

## 2022-12-12 MED ORDER — FLUTICASONE PROPIONATE 50 MCG/ACT NA SUSP
2.0000 | Freq: Every day | NASAL | 6 refills | Status: AC
Start: 2022-12-12 — End: ?

## 2022-12-12 NOTE — Progress Notes (Signed)
E-Visit for Upper Respiratory Infection   We are sorry you are not feeling well.  Here is how we plan to help!  Based on what you have shared with me, it looks like you may have a viral upper respiratory infection.  Upper respiratory infections are caused by a large number of viruses; however, rhinovirus is the most common cause.   Symptoms vary from person to person, with common symptoms including sore throat, cough, fatigue or lack of energy and feeling of general discomfort.  A low-grade fever of up to 100.4 may present, but is often uncommon.  Symptoms vary however, and are closely related to a person's age or underlying illnesses.  The most common symptoms associated with an upper respiratory infection are nasal discharge or congestion, cough, sneezing, headache and pressure in the ears and face.  These symptoms usually persist for about 3 to 10 days, but can last up to 2 weeks.  It is important to know that upper respiratory infections do not cause serious illness or complications in most cases.    Upper respiratory infections can be transmitted from person to person, with the most common method of transmission being a person's hands.  The virus is able to live on the skin and can infect other persons for up to 2 hours after direct contact.  Also, these can be transmitted when someone coughs or sneezes; thus, it is important to cover the mouth to reduce this risk.  To keep the spread of the illness at bay, good hand hygiene is very important.  This is an infection that is most likely caused by a virus. There are no specific treatments other than to help you with the symptoms until the infection runs its course.  We are sorry you are not feeling well.  Here is how we plan to help!   For nasal congestion, you may use an oral decongestants such as Mucinex D or if you have glaucoma or high blood pressure use plain Mucinex.  Saline nasal spray or nasal drops can help and can safely be used as often as  needed for congestion.  For your congestion, I have prescribed Fluticasone nasal spray one spray in each nostril twice a day  If you do not have a history of heart disease, hypertension, diabetes or thyroid disease, prostate/bladder issues or glaucoma, you may also use Sudafed to treat nasal congestion.  It is highly recommended that you consult with a pharmacist or your primary care physician to ensure this medication is safe for you to take.     If you have a cough, you may use cough suppressants such as Delsym and Robitussin.  If you have glaucoma or high blood pressure, you can also use Coricidin HBP.   For cough I have prescribed for you A prescription cough medication called Tessalon Perles 100 mg. You may take 1-2 capsules every 8 hours as needed for cough  If you have a sore or scratchy throat, use a saltwater gargle-  to  teaspoon of salt dissolved in a 4-ounce to 8-ounce glass of warm water.  Gargle the solution for approximately 15-30 seconds and then spit.  It is important not to swallow the solution.  You can also use throat lozenges/cough drops and Chloraseptic spray to help with throat pain or discomfort.  Warm or cold liquids can also be helpful in relieving throat pain.  For headache, pain or general discomfort, you can use Ibuprofen or Tylenol as directed.   Some authorities believe   that zinc sprays or the use of Echinacea may shorten the course of your symptoms.   HOME CARE Only take medications as instructed by your medical team. Be sure to drink plenty of fluids. Water is fine as well as fruit juices, sodas and electrolyte beverages. You may want to stay away from caffeine or alcohol. If you are nauseated, try taking small sips of liquids. How do you know if you are getting enough fluid? Your urine should be a pale yellow or almost colorless. Get rest. Taking a steamy shower or using a humidifier may help nasal congestion and ease sore throat pain. You can place a towel over  your head and breathe in the steam from hot water coming from a faucet. Using a saline nasal spray works much the same way. Cough drops, hard candies and sore throat lozenges may ease your cough. Avoid close contacts especially the very young and the elderly Cover your mouth if you cough or sneeze Always remember to wash your hands.   GET HELP RIGHT AWAY IF: You develop worsening fever. If your symptoms do not improve within 10 days You develop yellow or green discharge from your nose over 3 days. You have coughing fits You develop a severe head ache or visual changes. You develop shortness of breath, difficulty breathing or start having chest pain Your symptoms persist after you have completed your treatment plan  MAKE SURE YOU  Understand these instructions. Will watch your condition. Will get help right away if you are not doing well or get worse.  Thank you for choosing an e-visit.  Your e-visit answers were reviewed by a board certified advanced clinical practitioner to complete your personal care plan. Depending upon the condition, your plan could have included both over the counter or prescription medications.  Please review your pharmacy choice. Make sure the pharmacy is open so you can pick up prescription now. If there is a problem, you may contact your provider through [employer] and have the prescription routed to another pharmacy.  Your safety is important to [place]. If you have drug allergies check your prescription carefully.   For the next 24 hours you can use MyChart to ask questions about today's visit, request a non-urgent call back, or ask for a work or school excuse. You will get an email in the next two days asking about your experience. I hope that your e-visit has been valuable and will speed your recovery.  Meds ordered this encounter  Medications   fluticasone (FLONASE) 50 MCG/ACT nasal spray    Sig: Place 2 sprays into both nostrils daily.    Dispense:   16 g    Refill:  6   benzonatate (TESSALON) 100 MG capsule    Sig: Take 1 capsule (100 mg total) by mouth 3 (three) times daily as needed.    Dispense:  30 capsule    Refill:  0     I spent approximately 5 minutes reviewing the patient's history, current symptoms and coordinating their care today.

## 2022-12-24 ENCOUNTER — Encounter: Payer: Self-pay | Admitting: Internal Medicine

## 2023-01-04 ENCOUNTER — Other Ambulatory Visit: Payer: Self-pay | Admitting: Internal Medicine

## 2023-01-04 DIAGNOSIS — I10 Essential (primary) hypertension: Secondary | ICD-10-CM

## 2023-03-01 ENCOUNTER — Other Ambulatory Visit: Payer: Self-pay | Admitting: Internal Medicine

## 2023-03-01 DIAGNOSIS — I119 Hypertensive heart disease without heart failure: Secondary | ICD-10-CM

## 2023-03-01 DIAGNOSIS — I10 Essential (primary) hypertension: Secondary | ICD-10-CM

## 2023-03-04 ENCOUNTER — Ambulatory Visit (INDEPENDENT_AMBULATORY_CARE_PROVIDER_SITE_OTHER): Payer: BC Managed Care – PPO | Admitting: Internal Medicine

## 2023-03-04 ENCOUNTER — Encounter: Payer: Self-pay | Admitting: Internal Medicine

## 2023-03-04 VITALS — BP 136/82 | HR 75 | Temp 97.9°F | Wt 244.1 lb

## 2023-03-04 DIAGNOSIS — I10 Essential (primary) hypertension: Secondary | ICD-10-CM | POA: Diagnosis not present

## 2023-03-04 DIAGNOSIS — E785 Hyperlipidemia, unspecified: Secondary | ICD-10-CM

## 2023-03-04 DIAGNOSIS — E119 Type 2 diabetes mellitus without complications: Secondary | ICD-10-CM | POA: Insufficient documentation

## 2023-03-04 DIAGNOSIS — K2100 Gastro-esophageal reflux disease with esophagitis, without bleeding: Secondary | ICD-10-CM | POA: Diagnosis not present

## 2023-03-04 DIAGNOSIS — R739 Hyperglycemia, unspecified: Secondary | ICD-10-CM | POA: Insufficient documentation

## 2023-03-04 DIAGNOSIS — E1169 Type 2 diabetes mellitus with other specified complication: Secondary | ICD-10-CM

## 2023-03-04 LAB — BASIC METABOLIC PANEL
BUN: 13 mg/dL (ref 6–23)
CO2: 30 meq/L (ref 19–32)
Calcium: 9.5 mg/dL (ref 8.4–10.5)
Chloride: 99 meq/L (ref 96–112)
Creatinine, Ser: 1.17 mg/dL (ref 0.40–1.50)
GFR: 71.75 mL/min (ref >60.00)
Glucose, Bld: 131 mg/dL — ABNORMAL HIGH (ref 70–99)
Potassium: 3.7 meq/L (ref 3.5–5.1)
Sodium: 138 meq/L (ref 135–145)

## 2023-03-04 LAB — CBC WITH DIFFERENTIAL/PLATELET
Basophils Absolute: 0.1 10*3/uL (ref 0.0–0.1)
Basophils Relative: 0.5 % (ref 0.0–3.0)
Eosinophils Absolute: 0.2 10*3/uL (ref 0.0–0.7)
Eosinophils Relative: 1.5 % (ref 0.0–5.0)
HCT: 46 % (ref 39.0–52.0)
Hemoglobin: 14.7 g/dL (ref 13.0–17.0)
Lymphocytes Relative: 26 % (ref 12.0–46.0)
Lymphs Abs: 3.2 10*3/uL (ref 0.7–4.0)
MCHC: 31.9 g/dL (ref 30.0–36.0)
MCV: 90.9 fL (ref 78.0–100.0)
Monocytes Absolute: 1.1 10*3/uL — ABNORMAL HIGH (ref 0.1–1.0)
Monocytes Relative: 8.6 % (ref 3.0–12.0)
Neutro Abs: 7.8 10*3/uL — ABNORMAL HIGH (ref 1.4–7.7)
Neutrophils Relative %: 63.4 % (ref 43.0–77.0)
Platelets: 207 10*3/uL (ref 150.0–400.0)
RBC: 5.06 Mil/uL (ref 4.22–5.81)
RDW: 14.7 % (ref 11.5–15.5)
WBC: 12.2 10*3/uL — ABNORMAL HIGH (ref 4.0–10.5)

## 2023-03-04 LAB — LIPID PANEL
Cholesterol: 150 mg/dL (ref 0–200)
HDL: 45.8 mg/dL (ref >39.00)
LDL Cholesterol: 66 mg/dL (ref 0–99)
NonHDL: 104.43
Total CHOL/HDL Ratio: 3
Triglycerides: 190 mg/dL — ABNORMAL HIGH (ref 0.0–149.0)
VLDL: 38 mg/dL (ref 0.0–40.0)

## 2023-03-04 LAB — HEMOGLOBIN A1C: Hgb A1c MFr Bld: 7.5 % — ABNORMAL HIGH (ref 4.6–6.5)

## 2023-03-04 MED ORDER — TIRZEPATIDE 2.5 MG/0.5ML ~~LOC~~ SOAJ: 2.5000 mg | SUBCUTANEOUS | 0 refills | Status: DC

## 2023-03-04 NOTE — Patient Instructions (Signed)
Hypertension, Adult High blood pressure (hypertension) is when the force of blood pumping through the arteries is too strong. The arteries are the blood vessels that carry blood from the heart throughout the body. Hypertension forces the heart to work harder to pump blood and may cause arteries to become narrow or stiff. Untreated or uncontrolled hypertension can lead to a heart attack, heart failure, a stroke, kidney disease, and other problems. A blood pressure reading consists of a higher number over a lower number. Ideally, your blood pressure should be below 120/80. The first ("top") number is called the systolic pressure. It is a measure of the pressure in your arteries as your heart beats. The second ("bottom") number is called the diastolic pressure. It is a measure of the pressure in your arteries as the heart relaxes. What are the causes? The exact cause of this condition is not known. There are some conditions that result in high blood pressure. What increases the risk? Certain factors may make you more likely to develop high blood pressure. Some of these risk factors are under your control, including: Smoking. Not getting enough exercise or physical activity. Being overweight. Having too much fat, sugar, calories, or salt (sodium) in your diet. Drinking too much alcohol. Other risk factors include: Having a personal history of heart disease, diabetes, high cholesterol, or kidney disease. Stress. Having a family history of high blood pressure and high cholesterol. Having obstructive sleep apnea. Age. The risk increases with age. What are the signs or symptoms? High blood pressure may not cause symptoms. Very high blood pressure (hypertensive crisis) may cause: Headache. Fast or irregular heartbeats (palpitations). Shortness of breath. Nosebleed. Nausea and vomiting. Vision changes. Severe chest pain, dizziness, and seizures. How is this diagnosed? This condition is diagnosed by  measuring your blood pressure while you are seated, with your arm resting on a flat surface, your legs uncrossed, and your feet flat on the floor. The cuff of the blood pressure monitor will be placed directly against the skin of your upper arm at the level of your heart. Blood pressure should be measured at least twice using the same arm. Certain conditions can cause a difference in blood pressure between your right and left arms. If you have a high blood pressure reading during one visit or you have normal blood pressure with other risk factors, you may be asked to: Return on a different day to have your blood pressure checked again. Monitor your blood pressure at home for 1 week or longer. If you are diagnosed with hypertension, you may have other blood or imaging tests to help your health care provider understand your overall risk for other conditions. How is this treated? This condition is treated by making healthy lifestyle changes, such as eating healthy foods, exercising more, and reducing your alcohol intake. You may be referred for counseling on a healthy diet and physical activity. Your health care provider may prescribe medicine if lifestyle changes are not enough to get your blood pressure under control and if: Your systolic blood pressure is above 130. Your diastolic blood pressure is above 80. Your personal target blood pressure may vary depending on your medical conditions, your age, and other factors. Follow these instructions at home: Eating and drinking  Eat a diet that is high in fiber and potassium, and low in sodium, added sugar, and fat. An example of this eating plan is called the DASH diet. DASH stands for Dietary Approaches to Stop Hypertension. To eat this way: Eat   plenty of fresh fruits and vegetables. Try to fill one half of your plate at each meal with fruits and vegetables. Eat whole grains, such as whole-wheat pasta, brown rice, or whole-grain bread. Fill about one  fourth of your plate with whole grains. Eat or drink low-fat dairy products, such as skim milk or low-fat yogurt. Avoid fatty cuts of meat, processed or cured meats, and poultry with skin. Fill about one fourth of your plate with lean proteins, such as fish, chicken without skin, beans, eggs, or tofu. Avoid pre-made and processed foods. These tend to be higher in sodium, added sugar, and fat. Reduce your daily sodium intake. Many people with hypertension should eat less than 1,500 mg of sodium a day. Do not drink alcohol if: Your health care provider tells you not to drink. You are pregnant, may be pregnant, or are planning to become pregnant. If you drink alcohol: Limit how much you have to: 0-1 drink a day for women. 0-2 drinks a day for men. Know how much alcohol is in your drink. In the [place] one drink equals one 12 oz bottle of beer (355 mL), one 5 oz glass of wine (148 mL), or one 1 oz glass of hard liquor (44 mL). Lifestyle  Work with your health care provider to maintain a healthy body weight or to lose weight. Ask what an ideal weight is for you. Get at least 30 minutes of exercise that causes your heart to beat faster (aerobic exercise) most days of the week. Activities may include walking, swimming, or biking. Include exercise to strengthen your muscles (resistance exercise), such as Pilates or lifting weights, as part of your weekly exercise routine. Try to do these types of exercises for 30 minutes at least 3 days a week. Do not use any products that contain nicotine or tobacco. These products include cigarettes, chewing tobacco, and vaping devices, such as e-cigarettes. If you need help quitting, ask your health care provider. Monitor your blood pressure at home as told by your health care provider. Keep all follow-up visits. This is important. Medicines Take over-the-counter and prescription medicines only as told by your health care provider. Follow directions carefully. Blood  pressure medicines must be taken as prescribed. Do not skip doses of blood pressure medicine. Doing this puts you at risk for problems and can make the medicine less effective. Ask your health care provider about side effects or reactions to medicines that you should watch for. Contact a health care provider if you: Think you are having a reaction to a medicine you are taking. Have headaches that keep coming back (recurring). Feel dizzy. Have swelling in your ankles. Have trouble with your vision. Get help right away if you: Develop a severe headache or confusion. Have unusual weakness or numbness. Feel faint. Have severe pain in your chest or abdomen. Vomit repeatedly. Have trouble breathing. These symptoms may be an emergency. Get help right away. Call 911. Do not wait to see if the symptoms will go away. Do not drive yourself to the hospital. Summary Hypertension is when the force of blood pumping through your arteries is too strong. If this condition is not controlled, it may put you at risk for serious complications. Your personal target blood pressure may vary depending on your medical conditions, your age, and other factors. For most people, a normal blood pressure is less than 120/80. Hypertension is treated with lifestyle changes, medicines, or a combination of both. Lifestyle changes include losing weight, eating a healthy,   low-sodium diet, exercising more, and limiting alcohol. This information is not intended to replace advice given to you by your health care provider. Make sure you discuss any questions you have with your health care provider. Document Revised: 03/07/2021 Document Reviewed: 03/07/2021 Elsevier Patient Education  2024 Elsevier Inc.  

## 2023-03-04 NOTE — Progress Notes (Signed)
Subjective:  Patient ID: Jacob Cross., male    DOB: Aug 31, 1970  Age: 52 y.o. MRN: [id number]  CC: Gastroesophageal Reflux, Hyperlipidemia, and Hypertension   HPI [name]. presents for f/up ------  Discussed the use of AI scribe software for clinical note transcription with the patient, who gave verbal consent to proceed.  History of Present Illness   The patient, with a history of hypertension, presents with occasional slight headaches. He denies any symptoms of blurred vision, dizziness, lightheadedness, chest pain, or edema. He also denies any symptoms suggestive of hypotension such as feeling faint or on the verge of passing out.  The patient expresses dissatisfaction with his current generic antihypertensive medication, preferring the brand name. He denies any current coughing, wheezing, or shortness of breath.  Regarding immunizations, the patient has had negative experiences with flu vaccines in the past, reporting illness following administration. He has chosen not to continue with flu vaccinations.  The patient's blood pressure at the time of the consultation was 136/82, which he was pleased with.       Outpatient Medications Prior to Visit  Medication Sig Dispense Refill   esomeprazole (NEXIUM) 40 MG capsule TAKE 1 CAPSULE BY MOUTH EVERY DAY 90 capsule 1   fluticasone (FLONASE) 50 MCG/ACT nasal spray Place 2 sprays into both nostrils daily. 16 g 6   nebivolol (BYSTOLIC) 2.5 MG tablet TAKE 1 TABLET BY MOUTH EVERY DAY 90 tablet 1   rosuvastatin (CRESTOR) 5 MG tablet Take 1 tablet (5 mg total) by mouth daily. 90 tablet 1   tadalafil (CIALIS) 20 MG tablet TAKE 1 TABLET BY MOUTH EVERY DAY AS NEEDED 4 tablet 2   indapamide (LOZOL) 1.25 MG tablet TAKE 1 TABLET BY MOUTH DAILY. (Patient not taking: Reported on 03/04/2023) 90 tablet 0   benzonatate (TESSALON) 100 MG capsule Take 1 capsule (100 mg total) by mouth 3 (three) times daily as needed. (Patient not taking:  Reported on 03/04/2023) 30 capsule 0   No facility-administered medications prior to visit.    ROS Review of Systems  Constitutional:  Positive for unexpected weight change (wt gain). Negative for appetite change, chills, diaphoresis and fatigue.  HENT: Negative.    Eyes:  Negative for visual disturbance.  Respiratory:  Positive for apnea. Negative for cough and shortness of breath.   Cardiovascular:  Negative for chest pain, palpitations and leg swelling.  Gastrointestinal:  Negative for abdominal pain, constipation, diarrhea, nausea and vomiting.  Genitourinary: Negative.  Negative for difficulty urinating.  Musculoskeletal: Negative.  Negative for arthralgias, myalgias and neck pain.  Skin: Negative.  Negative for color change.  Neurological:  Positive for headaches.  Hematological:  Negative for adenopathy. Does not bruise/bleed easily.  Psychiatric/Behavioral: Negative.      Objective:  BP 136/82   Pulse 75   Temp 97.9 F (36.6 C) (Temporal)   Wt 244 lb 2 oz (110.7 kg)   SpO2 95%   BMI 39.40 kg/m   BP Readings from Last 3 Encounters:  03/04/23 136/82  12/03/22 (!) 148/88  05/30/22 138/86    Wt Readings from Last 3 Encounters:  03/04/23 244 lb 2 oz (110.7 kg)  12/03/22 241 lb (109.3 kg)  05/30/22 239 lb (108.4 kg)    Physical Exam Vitals reviewed.  Constitutional:      Appearance: Normal appearance.  HENT:     Mouth/Throat:     Mouth: Mucous membranes are moist.  Eyes:     General: No scleral icterus.  Conjunctiva/sclera: Conjunctivae normal.  Cardiovascular:     Rate and Rhythm: Normal rate and regular rhythm.     Heart sounds: No murmur heard. Pulmonary:     Effort: Pulmonary effort is normal.     Breath sounds: No stridor. No wheezing, rhonchi or rales.  Abdominal:     General: Abdomen is flat.     Palpations: There is no mass.     Tenderness: There is no abdominal tenderness. There is no guarding.     Hernia: No hernia is present.   Musculoskeletal:        General: Normal range of motion.     Cervical back: Neck supple.     Right lower leg: No edema.     Left lower leg: No edema.  Lymphadenopathy:     Cervical: No cervical adenopathy.  Skin:    General: Skin is warm.  Neurological:     General: No focal deficit present.     Mental Status: He is alert. Mental status is at baseline.  Psychiatric:        Mood and Affect: Mood normal.        Behavior: Behavior normal.     Lab Results  Component Value Date   WBC 12.2 (H) 03/04/2023   HGB 14.7 03/04/2023   HCT 46.0 03/04/2023   PLT 207.0 03/04/2023   GLUCOSE 131 (H) 03/04/2023   CHOL 150 03/04/2023   TRIG 190.0 (H) 03/04/2023   HDL 45.80 03/04/2023   LDLDIRECT 120.0 11/17/2015   LDLCALC 66 03/04/2023   ALT 33 12/03/2022   AST 21 12/03/2022   NA 138 03/04/2023   K 3.7 03/04/2023   CL 99 03/04/2023   CREATININE 1.17 03/04/2023   BUN 13 03/04/2023   CO2 30 03/04/2023   TSH 1.48 12/03/2022   PSA 0.46 12/03/2022   HGBA1C 7.5 (H) 03/04/2023    CT CARDIAC SCORING (SELF PAY ONLY)  Addendum Date: 12/21/2021   ADDENDUM REPORT: 12/21/2021 17:55 CLINICAL DATA:  Cardiovascular Disease Risk stratification EXAM: Coronary Calcium Score TECHNIQUE: A gated, non-contrast computed tomography scan of the heart was performed using 3mm slice thickness. Axial images were analyzed on a dedicated workstation. Calcium scoring of the coronary arteries was performed using the Agatston method. FINDINGS: Coronary arteries: Normal origins. Coronary Calcium Score: Total: 0 Pericardium: Normal. Ascending Aorta: Normal caliber. Non-cardiac: See separate report from [facility]. IMPRESSION: Coronary calcium score of 0. RECOMMENDATIONS: Coronary artery calcium (CAC) score is a strong predictor of incident coronary heart disease (CHD) and provides predictive information beyond traditional risk factors. CAC scoring is reasonable to use in the decision to withhold, postpone, or  initiate statin therapy in intermediate-risk or selected borderline-risk asymptomatic adults (age 4-75 years and LDL-C >=70 to <190 mg/dL) who do not have diabetes or established atherosclerotic cardiovascular disease (ASCVD).* In intermediate-risk (10-year ASCVD risk >=7.5% to <20%) adults or selected borderline-risk (10-year ASCVD risk >=5% to <7.5%) adults in whom a CAC score is measured for the purpose of making a treatment decision the following recommendations have been made: If CAC=0, it is reasonable to withhold statin therapy and reassess in 5 to 10 years, as long as higher risk conditions are absent (diabetes mellitus, family history of premature CHD in first degree relatives (males <55 years; females <65 years), cigarette smoking, or LDL >=190 mg/dL). If CAC is 1 to 99, it is reasonable to initiate statin therapy for patients >=69 years of age. If CAC is >=100 or >=75th percentile, it is reasonable to initiate  statin therapy at any age. Cardiology referral should be considered for patients with CAC scores >=400 or >=75th percentile. *2018 AHA/ACC/AACVPR/AAPA/ABC/ACPM/ADA/AGS/APhA/ASPC/NLA/PCNA Guideline on the Management of Blood Cholesterol: A Report of the American College of Cardiology/American Heart Association Task Force on Clinical Practice Guidelines. J Am Coll Cardiol. 2019;73(24):3168-3209. Electronically Signed   By: Donato Schultz M.D.   On: 12/21/2021 17:55   Result Date: 12/21/2021 EXAM: OVER-READ INTERPRETATION  CT CHEST The following report is a limited chest CT over-read performed by radiologist Dr. Charlett Nose of [facility] on 12/21/2021. This over-read does not include interpretation of cardiac or coronary anatomy or pathology. The coronary calcium score interpretation by the cardiologist is attached. COMPARISON:  None Available. FINDINGS: Vascular: Heart is normal size.  Aorta normal caliber. Mediastinum/Nodes: No adenopathy Lungs/Pleura: No confluent opacities or  effusions Upper Abdomen: No acute findings Musculoskeletal: Chest wall soft tissues are unremarkable. No acute bony abnormality IMPRESSION: No acute or significant extracardiac abnormality. Electronically Signed: By: Charlett Nose M.D. On: 12/21/2021 17:12    Assessment & Plan:   Dyslipidemia, goal LDL below 100 - LDL goal achieved. Doing well on the statin  -     Lipid panel; Future  Hyperglycemia -     Basic metabolic panel; Future -     Hemoglobin A1c; Future  Gastroesophageal reflux disease with esophagitis without hemorrhage -     CBC with Differential/Platelet; Future  Essential hypertension, benign - His BP is not at goal. He will work on his lifestyle modifications. -     Basic metabolic panel; Future -     AMB Referral [facility]BCI Care Management  Type 2 diabetes mellitus with other specified complication, without long-term current use of insulin (HCC)- Will start a GLP/GIP agonist. -     Tirzepatide; Inject 2.5 mg into the skin once a week.  Dispense: 2 mL; Refill: 0 -     AMB Referral VBCI Care Management -     Ambulatory referral to Ophthalmology     Follow-up: Return in about 6 months (around 09/02/2023).  Sanda Linger, MD

## 2023-03-05 ENCOUNTER — Encounter: Payer: Self-pay | Admitting: Internal Medicine

## 2023-03-07 ENCOUNTER — Telehealth: Payer: Self-pay

## 2023-03-07 MED ORDER — METFORMIN HCL ER 750 MG PO TB24: 750.0000 mg | ORAL_TABLET | Freq: Every day | ORAL | 0 refills | Status: DC

## 2023-03-07 NOTE — Addendum Note (Signed)
Addended by: Etta Grandchild on: 03/07/2023 10:17 AM   Modules accepted: Orders

## 2023-03-07 NOTE — Progress Notes (Signed)
   Care Guide Note  03/07/2023 Name: Jacob Cross. MRN: [id number] DOB: Jan 09, 1971  Referred by: Etta Grandchild, MD Reason for referral : Care Coordination (Outreach to schedule with Pharm d )   [name] is a 52 y.o. year old male who is a primary care patient of Etta Grandchild, MD. [name] was referred to the pharmacist for assistance related to DM.    Successful contact was made with the patient to discuss pharmacy services including being ready for the pharmacist to call at least 5 minutes before the scheduled appointment time, to have medication bottles and any blood sugar or blood pressure readings ready for review. The patient agreed to meet with the pharmacist via with the pharmacist via telephone visit on (date/time).  03/12/2023  Penne Lash, RMA Care Guide [facility]  [place], [place] [address] Direct Dial: [phone] [email or web address]

## 2023-03-12 ENCOUNTER — Other Ambulatory Visit: Payer: BC Managed Care – PPO | Admitting: Pharmacist

## 2023-03-12 NOTE — Progress Notes (Unsigned)
03/14/2023 Name: Jacob Cross. MRN: [id number] DOB: 1970/05/18  Chief Complaint  Patient presents with   Diabetes   Hypertension   Medication Management    Jacob Stockstill. is a 52 y.o. year old male who presented for a telephone visit.   They were referred to the pharmacist by their PCP for assistance in managing diabetes and hypertension.   Subjective:  Care Team: Primary Care Provider: Etta Grandchild, MD ; Next Scheduled Visit: none scheduled  Medication Access/Adherence  Current Pharmacy:  [facility] [phone] [place], [place] - [address] [place] [address] Phone: [phone] Fax: [phone]  [id number] [place], [place] - [address] [place] [address] Phone: [phone] Fax: [phone]   Patient reports affordability concerns with their medications: No  Patient reports access/transportation concerns to their pharmacy: No  Patient reports adherence concerns with their medications:  Yes    Pt has not started [facility] or metformin - was waiting for this call   Diabetes: *new diagnosis as of 03/04/23 labs  Current medications: none currently Medications tried in the past: n/a  *Pt would like to see if he can make lifestyle changes to improve BG before taking medication. He did pick up the [facility] but has not taken it.  Current physical activity: started going to the gym and exercising since getting news of diabetes diagnosis - doing weight training and cardio   Hypertension:  Current medications: nebivolol 2.5 mg daily. Not taking indapamide Medications previously tried:   Patient has a validated, automated, upper arm home BP cuff Current blood pressure readings readings: none recent   Objective:  BP Readings from Last 3 Encounters:  03/04/23 136/82  12/03/22 (!) 148/88  05/30/22 138/86    Lab Results  Component Value Date   HGBA1C 7.5 (H) 03/04/2023    Lab Results  Component Value Date    CREATININE 1.17 03/04/2023   BUN 13 03/04/2023   NA 138 03/04/2023   K 3.7 03/04/2023   CL 99 03/04/2023   CO2 30 03/04/2023    Lab Results  Component Value Date   CHOL 150 03/04/2023   HDL 45.80 03/04/2023   LDLCALC 66 03/04/2023   LDLDIRECT 120.0 11/17/2015   TRIG 190.0 (H) 03/04/2023   CHOLHDL 3 03/04/2023    Medications Reviewed Today     Reviewed by Bonita Quin, RPH (Pharmacist) on 03/12/23 at 1104  Med List Status: <None>   Medication Order Taking? Sig Documenting Provider Last Dose Status Informant  esomeprazole (NEXIUM) 40 MG capsule [id number] Yes TAKE 1 CAPSULE BY MOUTH EVERY DAY Etta Grandchild, MD Taking Active   fluticasone (FLONASE) 50 MCG/ACT nasal spray [id number] Yes Place 2 sprays into both nostrils daily. Viviano Simas, FNP Taking Active   indapamide (LOZOL) 1.25 MG tablet [id number] No TAKE 1 TABLET BY MOUTH DAILY.  Patient not taking: Reported on 03/04/2023   Etta Grandchild, MD Not Taking Active   metFORMIN (GLUCOPHAGE-XR) 750 MG 24 hr tablet [id number] No Take 1 tablet (750 mg total) by mouth daily with breakfast.  Patient not taking: Reported on 03/12/2023   Etta Grandchild, MD Not Taking Active   nebivolol (BYSTOLIC) 2.5 MG tablet [id number] Yes TAKE 1 TABLET BY MOUTH EVERY DAY Etta Grandchild, MD Taking Active   rosuvastatin (CRESTOR) 5 MG tablet [id number] Yes Take 1 tablet (5 mg total) by mouth daily. Etta Grandchild, MD Taking Active  tadalafil (CIALIS) 20 MG tablet [id number] Yes TAKE 1 TABLET BY MOUTH EVERY DAY AS NEEDED Etta Grandchild, MD Taking Active               Assessment/Plan:   Diabetes: - Currently uncontrolled, A1c goal <7% - Reviewed long term cardiovascular and renal outcomes of uncontrolled blood sugar - Reviewed goal A1c, diabetes diagnosis/pathophysiology - Reviewed dietary modifications including increasing lean protein and fiber, reducing high sugar/starch foods - Reviewed lifestyle modifications including: 150  min of exercise per week, encouraged both weight training and cardio - Recommend to work on lifestyle modifications and recheck A1c in 3 months. If no improvement, would start Mounjaro or metformin   Hypertension: - Currently uncontrolled, BP goal <130/80 - Reviewed long term cardiovascular and renal outcomes of uncontrolled blood pressure - Reviewed appropriate blood pressure monitoring technique and reviewed goal blood pressure. Recommended to check home blood pressure and heart rate - Recommend to continue nebivolol. Indapamide would be better for BP control over a beta blocker. Will discuss in future.     Follow Up Plan: 04/09/2023   Arbutus Leas, PharmD, BCPS Clinical Pharmacist [place] [facility] [phone]

## 2023-03-14 NOTE — Patient Instructions (Addendum)
It was a pleasure speaking with you!  As we discussed, focus on working on lifestyle changes to lower blood sugar.  Diet recommendations: - Focus on increasing fiber (fruits, vegetables, and whole grains) and increasing lean protein (chicken, [place], fish) in your snacks and meals. Reduce red meat, processed foods, and high fat foods.  - Eat balanced snacks/meals which means always make sure you have a carbohydrate with fiber paired with a protein source. - Roasted broccoli: use fresh broccoli, cut pieces off the stalk into a bowl. Drizzle with olive oil (about 2 tablespoons) and season with garlic powder, garlic salt (small amount), pepper and mix together. Spray baking pan with olive oil spray. Dump and spread out broccoli on pan. Bake for about 30 min at 350 degrees, moving broccoli around at least 1-2x before it is done. This same recipe can be used for other vegetables we discussed like red cabbage, sliced canned carrots, brussel sprouts.  Exercise recommendations: - Aim for 150 minutes per week (30 minutes, 5 days per week or 45 minutes 3-4 days per week) of moderate exercise such as walking and/or weight training.  Here are some links with additional information you might find helpful: - https://diabetes.org/food-nutrition/understanding-carbs - https://diabetesfoodhub.org/blog/what-diabetes-plate#:~:text=The%20Diabetes%20Plate%20Method%20is,you%20need%20is%20a%20plate! - https://diabetes.org/food-nutrition/eating-healthy/healthy-choices-fast-food  - https://diabetes.org/food-nutrition/meal-planning/grocery-shopping-game-plan  - https://diabetes.org/about-diabetes  Feel free to reach out with questions or concerns!  Arbutus Leas, PharmD, BCPS Clinical Pharmacist [place] [facility] at [facility] [phone]

## 2023-04-03 ENCOUNTER — Other Ambulatory Visit: Payer: Self-pay | Admitting: Internal Medicine

## 2023-04-03 DIAGNOSIS — E1169 Type 2 diabetes mellitus with other specified complication: Secondary | ICD-10-CM

## 2023-04-07 ENCOUNTER — Other Ambulatory Visit: Payer: Self-pay | Admitting: Internal Medicine

## 2023-04-07 DIAGNOSIS — K2100 Gastro-esophageal reflux disease with esophagitis, without bleeding: Secondary | ICD-10-CM

## 2023-04-09 ENCOUNTER — Telehealth: Payer: Self-pay | Admitting: Pharmacist

## 2023-04-09 ENCOUNTER — Other Ambulatory Visit: Payer: BC Managed Care – PPO

## 2023-04-09 NOTE — Telephone Encounter (Signed)
Called patient for 11 AM clinical pharmacist follow up appt. No answer, left message with call back number.  Arbutus Leas, PharmD, BCPS, CPP Clinical Pharmacist Practitioner [place] [facility] at [facility] [phone]

## 2023-04-24 ENCOUNTER — Other Ambulatory Visit: Payer: BC Managed Care – PPO | Admitting: Pharmacist

## 2023-04-24 DIAGNOSIS — E1169 Type 2 diabetes mellitus with other specified complication: Secondary | ICD-10-CM

## 2023-04-24 NOTE — Patient Instructions (Signed)
It was a pleasure speaking with you today!  Great job on making lifestyle changes! We will plan to check your A1c 1/21 to see if it has improved.  Feel free to call with any questions or concerns!  Arbutus Leas, PharmD, BCPS [facility] Clinical Pharmacist [facility] [phone]

## 2023-04-24 NOTE — Progress Notes (Signed)
04/24/2023 Name: Jacob Cross. MRN: [id number] DOB: [birth year]  Chief Complaint  Patient presents with   Diabetes   Hypertension   Medication Management    Jacob Cross. is a 52 y.o. year old male who presented for a telephone visit.   They were referred to the pharmacist by their PCP for assistance in managing diabetes and hypertension.   Subjective:  Care Team: Primary Care Provider: Etta Grandchild, MD ; Next Scheduled Visit: none scheduled  Medication Access/Adherence  Current Pharmacy:  [phone] [facility], [place] - [address] [place] [address] Phone: [phone] Fax: [phone]  [id number] [place], [place] - [address] [place] [address] Phone: [phone] Fax: [phone]   Patient reports affordability concerns with their medications: No  Patient reports access/transportation concerns to their pharmacy: No  Patient reports adherence concerns with their medications:  Yes      Diabetes: *new diagnosis as of 03/04/23 labs  Current medications: none currently Medications tried in the past: n/a  *Pt would like to see if he can make lifestyle changes to improve BG before taking medication. He did pick up the [facility] but has not taken it.  Current physical activity:  Since 10/29 appt, pt reports weight loss of 10-15 lbs, exercising about 4 days per week (treadmill and strength training)   Hypertension:  Current medications: nebivolol 2.5 mg daily. Not taking indapamide Medications previously tried:   Patient has a validated, automated, upper arm home BP cuff Current blood pressure readings readings: none recent -  states he plans to check today   Objective:  BP Readings from Last 3 Encounters:  03/04/23 136/82  12/03/22 (!) 148/88  05/30/22 138/86    Lab Results  Component Value Date   HGBA1C 7.5 (H) 03/04/2023    Lab Results  Component Value Date   CREATININE 1.17  03/04/2023   BUN 13 03/04/2023   NA 138 03/04/2023   K 3.7 03/04/2023   CL 99 03/04/2023   CO2 30 03/04/2023    Lab Results  Component Value Date   CHOL 150 03/04/2023   HDL 45.80 03/04/2023   LDLCALC 66 03/04/2023   LDLDIRECT 120.0 11/17/2015   TRIG 190.0 (H) 03/04/2023   CHOLHDL 3 03/04/2023    Medications Reviewed Today     Reviewed by Bonita Quin, RPH (Pharmacist) on 04/24/23 at 1640  Med List Status: <None>   Medication Order Taking? Sig Documenting Provider Last Dose Status Informant  esomeprazole (NEXIUM) 40 MG capsule [id number] Yes TAKE 1 CAPSULE BY MOUTH EVERY DAY Etta Grandchild, MD Taking Active   fluticasone (FLONASE) 50 MCG/ACT nasal spray [id number]  Place 2 sprays into both nostrils daily. [place] Simas, FNP  Active   indapamide (LOZOL) 1.25 MG tablet [id number] No TAKE 1 TABLET BY MOUTH DAILY.  Patient not taking: Reported on 03/04/2023   Etta Grandchild, MD Not Taking Active   metFORMIN (GLUCOPHAGE-XR) 750 MG 24 hr tablet [id number] No Take 1 tablet (750 mg total) by mouth daily with breakfast.  Patient not taking: Reported on 03/12/2023   Etta Grandchild, MD Not Taking Active   nebivolol (BYSTOLIC) 2.5 MG tablet [id number] Yes TAKE 1 TABLET BY MOUTH EVERY DAY Etta Grandchild, MD Taking Active   rosuvastatin (CRESTOR) 5 MG tablet [id number] Yes Take 1 tablet (5 mg total) by mouth daily. Etta Grandchild, MD Taking Active   tadalafil (CIALIS) 20  MG tablet [id number]  TAKE 1 TABLET BY MOUTH EVERY DAY AS NEEDED Etta Grandchild, MD  Active               Assessment/Plan:   Diabetes: - Currently uncontrolled, A1c goal <7% - Reviewed long term cardiovascular and renal outcomes of uncontrolled blood sugar - Reviewed goal A1c, diabetes diagnosis/pathophysiology - Reviewed dietary modifications including increasing lean protein and fiber, reducing high sugar/starch foods - Reviewed lifestyle modifications including: 150 min of exercise per week, encouraged  both weight training and cardio - Recommend to work on lifestyle modifications and recheck A1c in 3 months. If no improvement, would start Mounjaro or metformin - Order A1c for 1/21 walk in labs   Hypertension: - Currently uncontrolled, BP goal <130/80 - Reviewed long term cardiovascular and renal outcomes of uncontrolled blood pressure - Reviewed appropriate blood pressure monitoring technique and reviewed goal blood pressure. Recommended to check home blood pressure and heart rate - Recommend to continue nebivolol. Indapamide would be better for BP control over a beta blocker.    Follow Up Plan: 06/04/23 A1c   Arbutus Leas, PharmD, BCPS Clinical Pharmacist [facility] [phone]

## 2023-06-04 ENCOUNTER — Other Ambulatory Visit (INDEPENDENT_AMBULATORY_CARE_PROVIDER_SITE_OTHER): Payer: BC Managed Care – PPO

## 2023-06-04 ENCOUNTER — Other Ambulatory Visit (INDEPENDENT_AMBULATORY_CARE_PROVIDER_SITE_OTHER): Payer: BC Managed Care – PPO | Admitting: Pharmacist

## 2023-06-04 ENCOUNTER — Encounter: Payer: Self-pay | Admitting: Pharmacist

## 2023-06-04 DIAGNOSIS — E1169 Type 2 diabetes mellitus with other specified complication: Secondary | ICD-10-CM

## 2023-06-04 LAB — HEMOGLOBIN A1C: Hgb A1c MFr Bld: 6.8 % — ABNORMAL HIGH (ref 4.6–6.5)

## 2023-06-04 NOTE — Progress Notes (Signed)
06/04/2023 Name: Jacob Cross. MRN: [id number] DOB: 10-04-1970  Chief Complaint  Patient presents with   Hypertension   Diabetes   Medication Management    Jacob Cross. is a 53 y.o. year old male who presented for a telephone visit.   They were referred to the pharmacist by their PCP for assistance in managing diabetes and hypertension.   Subjective:  Care Team: Primary Care Provider: Etta Grandchild, MD ; Next Scheduled Visit: none scheduled  Medication Access/Adherence  Current Pharmacy[facility]  [phone], [place] - [address] [place] [address] Phone: [phone] Fax: [phone]  [id number] [place],  - [address] [place] [address] Phone: [phone] Fax: [phone]   Patient reports affordability concerns with their medications: No  Patient reports access/transportation concerns to their pharmacy: No  Patient reports adherence concerns with their medications:  Yes      Diabetes: *new diagnosis as of 03/04/23 labs  Current medications: none currently Medications tried in the past: n/a  *Pt would like to see if he can make lifestyle changes to improve BG before taking medication. He did pick up the [facility] but has not taken it.  Current physical activity:  Since 10/29 appt, pt reports weight loss of 10-15 lbs, exercising about 4 days per week (treadmill and strength training)   Hypertension:  Current medications: nebivolol 2.5 mg daily. Not taking indapamide Medications previously tried:   Patient has a validated, automated, upper arm home BP cuff Current blood pressure readings: reports readings 130/80s   Objective:  BP Readings from Last 3 Encounters:  03/04/23 136/82  12/03/22 (!) 148/88  05/30/22 138/86    Lab Results  Component Value Date   HGBA1C 6.8 (H) 06/04/2023    Lab Results  Component Value Date   CREATININE 1.17 03/04/2023   BUN 13 03/04/2023   NA  138 03/04/2023   K 3.7 03/04/2023   CL 99 03/04/2023   CO2 30 03/04/2023    Lab Results  Component Value Date   CHOL 150 03/04/2023   HDL 45.80 03/04/2023   LDLCALC 66 03/04/2023   LDLDIRECT 120.0 11/17/2015   TRIG 190.0 (H) 03/04/2023   CHOLHDL 3 03/04/2023    Medications Reviewed Today     Reviewed by Bonita Quin, RPH (Pharmacist) on 06/04/23 at 1147  Med List Status: <None>   Medication Order Taking? Sig Documenting Provider Last Dose Status Informant  esomeprazole (NEXIUM) 40 MG capsule [id number]  TAKE 1 CAPSULE BY MOUTH EVERY DAY Etta Grandchild, MD  Active   fluticasone (FLONASE) 50 MCG/ACT nasal spray [id number]  Place 2 sprays into both nostrils daily. [place] Simas, FNP  Active   indapamide (LOZOL) 1.25 MG tablet [id number] No TAKE 1 TABLET BY MOUTH DAILY.  Patient not taking: Reported on 06/04/2023   Etta Grandchild, MD Not Taking Active   metFORMIN (GLUCOPHAGE-XR) 750 MG 24 hr tablet [id number] No Take 1 tablet (750 mg total) by mouth daily with breakfast.  Patient not taking: Reported on 06/04/2023   Etta Grandchild, MD Not Taking Active   nebivolol (BYSTOLIC) 2.5 MG tablet [id number] Yes TAKE 1 TABLET BY MOUTH EVERY DAY Etta Grandchild, MD Taking Active   rosuvastatin (CRESTOR) 5 MG tablet [id number] Yes Take 1 tablet (5 mg total) by mouth daily. Etta Grandchild, MD Taking Active   tadalafil (CIALIS) 20 MG tablet [id number]  TAKE 1 TABLET BY  MOUTH EVERY DAY AS NEEDED Etta Grandchild, MD  Active               Assessment/Plan:   Diabetes: - Currently controlled, A1c goal <7% - improved from 7.5% with diet/exercise - Reviewed goal A1c,  - Reviewed dietary modifications including increasing lean protein and fiber, reducing high sugar/starch foods - Reviewed lifestyle modifications including: 150 min of exercise per week, encouraged both weight training and cardio - Recommend f/u with PCP April and check A1c   Hypertension: - Currently borderline  uncontrolled, BP goal <130/80 - Reviewed appropriate blood pressure monitoring technique and reviewed goal blood pressure. Recommended to check home blood pressure and heart rate - Recommend to continue nebivolol. Indapamide would be better for BP control over a beta blocker.    Follow Up Plan: Await PCP f/u 4/21 or later   Arbutus Leas, PharmD, BCPS, CPP Clinical Pharmacist Practitioner [facility] [phone]

## 2023-06-04 NOTE — Patient Instructions (Signed)
It was a pleasure speaking with you today!  Great work getting your A1c down - continue diet/exercise. Schedule appt to see Dr. Yetta Barre 09/02/23 or later for 6 month follow up.  Feel free to call with any questions or concerns!  Arbutus Leas, PharmD, BCPS, CPP Clinical Pharmacist Practitioner [facility] at [facility] [phone]

## 2023-06-09 ENCOUNTER — Other Ambulatory Visit: Payer: Self-pay | Admitting: Internal Medicine

## 2023-06-09 DIAGNOSIS — E785 Hyperlipidemia, unspecified: Secondary | ICD-10-CM

## 2023-06-11 ENCOUNTER — Other Ambulatory Visit: Payer: Self-pay | Admitting: Internal Medicine

## 2023-06-11 DIAGNOSIS — E1169 Type 2 diabetes mellitus with other specified complication: Secondary | ICD-10-CM

## 2023-06-21 ENCOUNTER — Telehealth: Payer: BC Managed Care – PPO | Admitting: Emergency Medicine

## 2023-06-21 DIAGNOSIS — R6889 Other general symptoms and signs: Secondary | ICD-10-CM | POA: Diagnosis not present

## 2023-06-21 MED ORDER — BENZONATATE 100 MG PO CAPS: 100.0000 mg | ORAL_CAPSULE | Freq: Two times a day (BID) | ORAL | 0 refills | Status: DC | PRN

## 2023-06-21 MED ORDER — OSELTAMIVIR PHOSPHATE 75 MG PO CAPS: 75.0000 mg | ORAL_CAPSULE | Freq: Two times a day (BID) | ORAL | 0 refills | Status: DC

## 2023-06-21 NOTE — Progress Notes (Signed)
 Virtual Visit Consent   Jacob Pinedo., you are scheduled for a virtual visit with a [facility] provider today. Just as with appointments in the office, your consent must be obtained to participate. Your consent will be active for this visit and any virtual visit you may have with one of our providers in the next 365 days. If you have a MyChart account, a copy of this consent can be sent to you electronically.  As this is a virtual visit, video technology does not allow for your provider to perform a traditional examination. This may limit your provider's ability to fully assess your condition. If your provider identifies any concerns that need to be evaluated in person or the need to arrange testing (such as labs, EKG, etc.), we will make arrangements to do so. Although advances in technology are sophisticated, we cannot ensure that it will always work on either your end or our end. If the connection with a video visit is poor, the visit may have to be switched to a telephone visit. With either a video or telephone visit, we are not always able to ensure that we have a secure connection.  By engaging in this virtual visit, you consent to the provision of healthcare and authorize for your insurance to be billed (if applicable) for the services provided during this visit. Depending on your insurance coverage, you may receive a charge related to this service.  I need to obtain your verbal consent now. Are you willing to proceed with your visit today? Jacob Balan. has provided verbal consent on 06/21/2023 for a virtual visit (video or telephone). Lamar Schlossman, PA-C  Date: 06/21/2023 2:21 PM  Virtual Visit via Video Note   I, Lamar Schlossman, connected with  Jacob Cross  ([id number], November 04, 1970) on 06/21/23 at  2:15 PM EST by a video-enabled telemedicine application and verified that I am speaking with the correct person using two identifiers.  Location: Patient: Virtual Visit Location  Patient: Home Provider: Virtual Visit Location Provider: Home Office   I discussed the limitations of evaluation and management by telemedicine and the availability of in person appointments. The patient expressed understanding and agreed to proceed.    History of Present Illness: Jacob Hardcastle. is a 53 y.o. who identifies as a male who was assigned male at birth, and is being seen today for cough, body ache, and headache.  States that his symptoms started 2-3 days ago.  Reports subjective fever at home.  Denies known sick contacts.  Has tried OTC meds.  States that his symptoms started quickly.   He states he needs a note for work.  HPI: HPI  Problems:  Patient Active Problem List   Diagnosis Date Noted   Hyperglycemia 03/04/2023   Diabetes mellitus (HCC) 03/04/2023   Hypertensive left ventricular hypertrophy, without heart failure 12/09/2022   Dyslipidemia, goal LDL below 100 12/09/2022   Ganglion of flexor tendon sheath of right ring finger 05/30/2022   Abnormal electrocardiogram (ECG) (EKG) 11/27/2021   Spinal stenosis of lumbar region with neurogenic claudication 03/21/2021   Encounter for general adult medical examination with abnormal findings 02/24/2021   Chronic left-sided low back pain with left-sided sciatica 02/23/2021   Herpes labialis 07/25/2020   Episodic circadian rhythm sleep disorder, shift work type 07/04/2017   Poor compliance with CPAP treatment 07/04/2017   Class 2 severe obesity due to excess calories with serious comorbidity and body mass index (BMI) of 35.0 to 35.9 in  adult (HCC) 07/04/2017   Erectile dysfunction 08/28/2013   Essential hypertension, benign 05/29/2013   OSA (obstructive sleep apnea) 02/26/2013   Allergic rhinitis 10/06/2009   GERD 10/06/2009   BPH (benign prostatic hyperplasia) 10/06/2009    Allergies: No Known Allergies Medications:  Current Outpatient Medications:    benzonatate  (TESSALON ) 100 MG capsule, Take 1 capsule (100 mg total)  by mouth 2 (two) times daily as needed for cough., Disp: 20 capsule, Rfl: 0   oseltamivir  (TAMIFLU ) 75 MG capsule, Take 1 capsule (75 mg total) by mouth every 12 (twelve) hours., Disp: 10 capsule, Rfl: 0   esomeprazole  (NEXIUM ) 40 MG capsule, TAKE 1 CAPSULE BY MOUTH EVERY DAY, Disp: 90 capsule, Rfl: 1   fluticasone  (FLONASE ) 50 MCG/ACT nasal spray, Place 2 sprays into both nostrils daily., Disp: 16 g, Rfl: 6   indapamide  (LOZOL ) 1.25 MG tablet, TAKE 1 TABLET BY MOUTH DAILY. (Patient not taking: Reported on 06/04/2023), Disp: 90 tablet, Rfl: 0   metFORMIN  (GLUCOPHAGE -XR) 750 MG 24 hr tablet, TAKE 1 TABLET BY MOUTH EVERY DAY WITH BREAKFAST, Disp: 90 tablet, Rfl: 0   nebivolol  (BYSTOLIC ) 2.5 MG tablet, TAKE 1 TABLET BY MOUTH EVERY DAY, Disp: 90 tablet, Rfl: 1   rosuvastatin  (CRESTOR ) 5 MG tablet, TAKE 1 TABLET (5 MG TOTAL) BY MOUTH DAILY., Disp: 90 tablet, Rfl: 0   tadalafil  (CIALIS ) 20 MG tablet, TAKE 1 TABLET BY MOUTH EVERY DAY AS NEEDED, Disp: 4 tablet, Rfl: 2  Observations/Objective: Patient is well-developed, well-nourished in no acute distress.  Resting comfortably at home.  Head is normocephalic, atraumatic.  No labored breathing.  Speech is clear and coherent with logical content.  Patient is alert and oriented at baseline.    Assessment and Plan: 1. Flu-like symptoms (Primary)   Meds ordered this encounter  Medications   oseltamivir  (TAMIFLU ) 75 MG capsule    Sig: Take 1 capsule (75 mg total) by mouth every 12 (twelve) hours.    Dispense:  10 capsule    Refill:  0    Supervising Provider:   LAMPTEY, PHILIP O [[id number]]   benzonatate  (TESSALON ) 100 MG capsule    Sig: Take 1 capsule (100 mg total) by mouth 2 (two) times daily as needed for cough.    Dispense:  20 capsule    Refill:  0    Supervising Provider:   BLAISE ALEENE KIDD [id number]   Patient with flu like symptoms.  He's right on the edge of the effective treatment window.  Will sen Rx of Tamiflu  and Tessalon .  Follow  Up Instructions: I discussed the assessment and treatment plan with the patient. The patient was provided an opportunity to ask questions and all were answered. The patient agreed with the plan and demonstrated an understanding of the instructions.  A copy of instructions were sent to the patient via MyChart unless otherwise noted below.     The patient was advised to call back or seek an in-person evaluation if the symptoms worsen or if the condition fails to improve as anticipated.    Lamar Schlossman, PA-C

## 2023-06-21 NOTE — Patient Instructions (Signed)
 Sherida KANDICE Butler Mickey., thank you for joining Lamar Schlossman, PA-C for today's virtual visit.  While this provider is not your primary care provider (PCP), if your PCP is located in our provider database this encounter information will be shared with them immediately following your visit.   A [facility] MyChart account gives you access to today's visit and all your visits, tests, and labs performed at [facility]  click here if you don't have a [facility] MyChart account or go to [email or web address]  Consent: (Patient) Jacob Cross. provided verbal consent for this virtual visit at the beginning of the encounter.  Current Medications:  Current Outpatient Medications:    benzonatate  (TESSALON ) 100 MG capsule, Take 1 capsule (100 mg total) by mouth 2 (two) times daily as needed for cough., Disp: 20 capsule, Rfl: 0   oseltamivir  (TAMIFLU ) 75 MG capsule, Take 1 capsule (75 mg total) by mouth every 12 (twelve) hours., Disp: 10 capsule, Rfl: 0   esomeprazole  (NEXIUM ) 40 MG capsule, TAKE 1 CAPSULE BY MOUTH EVERY DAY, Disp: 90 capsule, Rfl: 1   fluticasone  (FLONASE ) 50 MCG/ACT nasal spray, Place 2 sprays into both nostrils daily., Disp: 16 g, Rfl: 6   indapamide  (LOZOL ) 1.25 MG tablet, TAKE 1 TABLET BY MOUTH DAILY. (Patient not taking: Reported on 06/04/2023), Disp: 90 tablet, Rfl: 0   metFORMIN  (GLUCOPHAGE -XR) 750 MG 24 hr tablet, TAKE 1 TABLET BY MOUTH EVERY DAY WITH BREAKFAST, Disp: 90 tablet, Rfl: 0   nebivolol  (BYSTOLIC ) 2.5 MG tablet, TAKE 1 TABLET BY MOUTH EVERY DAY, Disp: 90 tablet, Rfl: 1   rosuvastatin  (CRESTOR ) 5 MG tablet, TAKE 1 TABLET (5 MG TOTAL) BY MOUTH DAILY., Disp: 90 tablet, Rfl: 0   tadalafil  (CIALIS ) 20 MG tablet, TAKE 1 TABLET BY MOUTH EVERY DAY AS NEEDED, Disp: 4 tablet, Rfl: 2   Medications ordered in this encounter:  Meds ordered this encounter  Medications   oseltamivir  (TAMIFLU ) 75 MG capsule    Sig: Take 1 capsule (75 mg total) by mouth every 12  (twelve) hours.    Dispense:  10 capsule    Refill:  0    Supervising Provider:   LAMPTEY, PHILIP O [[id number]]   benzonatate  (TESSALON ) 100 MG capsule    Sig: Take 1 capsule (100 mg total) by mouth 2 (two) times daily as needed for cough.    Dispense:  20 capsule    Refill:  0    Supervising Provider:   BLAISE ALEENE KIDD [[id number]]     *If you need refills on other medications prior to your next appointment, please contact your pharmacy*  Follow-Up: Call back or seek an in-person evaluation if the symptoms worsen or if the condition fails to improve as anticipated.  [facility] [phone]  Other Instructions    If you have been instructed to have an in-person evaluation today at a local Urgent Care facility, please use the link below. It will take you to a list of all of our available [facility], including address, phone number and hours of operation. Please do not delay care.  [facility]  If you or a family member do not have a primary care provider, use the link below to schedule a visit and establish care. When you choose a [facility] primary care physician or advanced practice provider, you gain a long-term partner in health. Find a Primary Care Provider  Learn more about 's in-office and virtual care options: [facility] - Get Care Now

## 2023-07-04 ENCOUNTER — Other Ambulatory Visit: Payer: Self-pay | Admitting: Internal Medicine

## 2023-07-04 DIAGNOSIS — I10 Essential (primary) hypertension: Secondary | ICD-10-CM

## 2023-09-04 ENCOUNTER — Ambulatory Visit: Admitting: Internal Medicine

## 2023-09-04 ENCOUNTER — Other Ambulatory Visit (HOSPITAL_COMMUNITY): Payer: Self-pay

## 2023-09-04 ENCOUNTER — Encounter: Payer: Self-pay | Admitting: Internal Medicine

## 2023-09-04 ENCOUNTER — Telehealth: Payer: Self-pay

## 2023-09-04 VITALS — BP 136/82 | HR 69 | Temp 98.0°F | Ht 66.0 in | Wt 232.0 lb

## 2023-09-04 DIAGNOSIS — E876 Hypokalemia: Secondary | ICD-10-CM

## 2023-09-04 DIAGNOSIS — N522 Drug-induced erectile dysfunction: Secondary | ICD-10-CM

## 2023-09-04 DIAGNOSIS — I10 Essential (primary) hypertension: Secondary | ICD-10-CM

## 2023-09-04 DIAGNOSIS — E785 Hyperlipidemia, unspecified: Secondary | ICD-10-CM | POA: Diagnosis not present

## 2023-09-04 DIAGNOSIS — I119 Hypertensive heart disease without heart failure: Secondary | ICD-10-CM

## 2023-09-04 DIAGNOSIS — E1169 Type 2 diabetes mellitus with other specified complication: Secondary | ICD-10-CM | POA: Diagnosis not present

## 2023-09-04 DIAGNOSIS — L2084 Intrinsic (allergic) eczema: Secondary | ICD-10-CM

## 2023-09-04 DIAGNOSIS — Z7984 Long term (current) use of oral hypoglycemic drugs: Secondary | ICD-10-CM

## 2023-09-04 DIAGNOSIS — T502X5A Adverse effect of carbonic-anhydrase inhibitors, benzothiadiazides and other diuretics, initial encounter: Secondary | ICD-10-CM | POA: Insufficient documentation

## 2023-09-04 LAB — URINALYSIS, ROUTINE W REFLEX MICROSCOPIC
Bilirubin Urine: NEGATIVE
Hgb urine dipstick: NEGATIVE
Ketones, ur: 15 — AB
Leukocytes,Ua: NEGATIVE
Nitrite: NEGATIVE
RBC / HPF: NONE SEEN (ref 0–?)
Specific Gravity, Urine: 1.015 (ref 1.000–1.030)
Total Protein, Urine: NEGATIVE
Urine Glucose: NEGATIVE
Urobilinogen, UA: 0.2 (ref 0.0–1.0)
WBC, UA: NONE SEEN (ref 0–?)
pH: 6 (ref 5.0–8.0)

## 2023-09-04 LAB — BASIC METABOLIC PANEL WITH GFR
BUN: 18 mg/dL (ref 6–23)
CO2: 29 meq/L (ref 19–32)
Calcium: 8.9 mg/dL (ref 8.4–10.5)
Chloride: 99 meq/L (ref 96–112)
Creatinine, Ser: 1.11 mg/dL (ref 0.40–1.50)
GFR: 76.16 mL/min (ref >60.00)
Glucose, Bld: 100 mg/dL — ABNORMAL HIGH (ref 70–99)
Potassium: 3.3 meq/L — ABNORMAL LOW (ref 3.5–5.1)
Sodium: 136 meq/L (ref 135–145)

## 2023-09-04 LAB — CBC WITH DIFFERENTIAL/PLATELET
Basophils Absolute: 0 10*3/uL (ref 0.0–0.1)
Basophils Relative: 0.4 % (ref 0.0–3.0)
Eosinophils Absolute: 0.2 10*3/uL (ref 0.0–0.7)
Eosinophils Relative: 1.6 % (ref 0.0–5.0)
HCT: 44.9 % (ref 39.0–52.0)
Hemoglobin: 14.6 g/dL (ref 13.0–17.0)
Lymphocytes Relative: 23.3 % (ref 12.0–46.0)
Lymphs Abs: 2.8 10*3/uL (ref 0.7–4.0)
MCHC: 32.4 g/dL (ref 30.0–36.0)
MCV: 90.4 fl (ref 78.0–100.0)
Monocytes Absolute: 1 10*3/uL (ref 0.1–1.0)
Monocytes Relative: 8.2 % (ref 3.0–12.0)
Neutro Abs: 8 10*3/uL — ABNORMAL HIGH (ref 1.4–7.7)
Neutrophils Relative %: 66.5 % (ref 43.0–77.0)
Platelets: 193 10*3/uL (ref 150.0–400.0)
RBC: 4.97 Mil/uL (ref 4.22–5.81)
RDW: 15.1 % (ref 11.5–15.5)
WBC: 12.1 10*3/uL — ABNORMAL HIGH (ref 4.0–10.5)

## 2023-09-04 LAB — HEPATIC FUNCTION PANEL
ALT: 25 U/L (ref 0–53)
AST: 22 U/L (ref 0–37)
Albumin: 4.6 g/dL (ref 3.5–5.2)
Alkaline Phosphatase: 57 U/L (ref 39–117)
Bilirubin, Direct: 0.1 mg/dL (ref 0.0–0.3)
Total Bilirubin: 0.5 mg/dL (ref 0.2–1.2)
Total Protein: 7.2 g/dL (ref 6.0–8.3)

## 2023-09-04 LAB — MICROALBUMIN / CREATININE URINE RATIO
Creatinine,U: 146.7 mg/dL
Microalb Creat Ratio: UNDETERMINED mg/g (ref 0.0–30.0)
Microalb, Ur: <0.7 mg/dL

## 2023-09-04 LAB — HEMOGLOBIN A1C: Hgb A1c MFr Bld: 6.3 % (ref 4.6–6.5)

## 2023-09-04 MED ORDER — ROSUVASTATIN CALCIUM 5 MG PO TABS: 5.0000 mg | ORAL_TABLET | Freq: Every day | ORAL | 0 refills | Status: DC

## 2023-09-04 MED ORDER — POTASSIUM CHLORIDE ER 10 MEQ PO TBCR: 10.0000 meq | EXTENDED_RELEASE_TABLET | Freq: Two times a day (BID) | ORAL | 0 refills | Status: DC

## 2023-09-04 MED ORDER — TADALAFIL 20 MG PO TABS: 20.0000 mg | ORAL_TABLET | Freq: Every day | ORAL | 2 refills | Status: DC | PRN

## 2023-09-04 MED ORDER — ZORYVE 0.15 % EX CREA: 1.0000 | TOPICAL_CREAM | Freq: Every day | CUTANEOUS | 2 refills | Status: AC

## 2023-09-04 NOTE — Progress Notes (Unsigned)
 Subjective:  Patient ID: Jacob Cross., male    DOB: 10/01/1970  Age: 53 y.o. MRN: [id number]  CC: Hypertension, Rash, Hyperlipidemia, and Diabetes   HPI Jacob Cross. presents for f/up ----  Discussed the use of AI scribe software for clinical note transcription with the patient, who gave verbal consent to proceed.  History of Present Illness   Jacob Cross. is a 53 year old male who presents for a routine follow-up visit.  He feels generally well and remains active. No chest pain, shortness of breath, dizziness, or lightheadedness. He denies symptoms related to blood sugar such as excessive thirst or urination. He has been losing weight through dietary changes and increased physical activity.  He undergoes an annual eye exam, with the last one conducted in July or August of the previous year. During these exams, the backs of his eyes are checked using a blue light technique.  He is no longer taking Tessalon  or Tamiflu  and has not experienced any side effects from his current medications.  He has a small, itchy spot on his leg that he wants to be evaluated.       Outpatient Medications Prior to Visit  Medication Sig Dispense Refill   esomeprazole  (NEXIUM ) 40 MG capsule TAKE 1 CAPSULE BY MOUTH EVERY DAY 90 capsule 1   fluticasone  (FLONASE ) 50 MCG/ACT nasal spray Place 2 sprays into both nostrils daily. (Patient taking differently: Place 2 sprays into both nostrils as needed for allergies.) 16 g 6   nebivolol  (BYSTOLIC ) 2.5 MG tablet TAKE 1 TABLET BY MOUTH EVERY DAY 90 tablet 0   rosuvastatin  (CRESTOR ) 5 MG tablet TAKE 1 TABLET (5 MG TOTAL) BY MOUTH DAILY. 90 tablet 0   tadalafil  (CIALIS ) 20 MG tablet TAKE 1 TABLET BY MOUTH EVERY DAY AS NEEDED 4 tablet 2   indapamide  (LOZOL ) 1.25 MG tablet TAKE 1 TABLET BY MOUTH DAILY. (Patient not taking: Reported on 03/04/2023) 90 tablet 0   metFORMIN  (GLUCOPHAGE -XR) 750 MG 24 hr tablet TAKE 1 TABLET BY MOUTH EVERY DAY WITH BREAKFAST  (Patient not taking: Reported on 09/04/2023) 90 tablet 0   benzonatate  (TESSALON ) 100 MG capsule Take 1 capsule (100 mg total) by mouth 2 (two) times daily as needed for cough. 20 capsule 0   oseltamivir  (TAMIFLU ) 75 MG capsule Take 1 capsule (75 mg total) by mouth every 12 (twelve) hours. 10 capsule 0   No facility-administered medications prior to visit.    ROS Review of Systems  Constitutional:  Negative for appetite change, chills, diaphoresis, fatigue, fever and unexpected weight change.  HENT: Negative.    Respiratory:  Positive for apnea. Negative for cough, shortness of breath and wheezing.   Cardiovascular:  Negative for chest pain, palpitations and leg swelling.  Gastrointestinal:  Negative for abdominal pain, constipation, diarrhea, nausea and vomiting.  Genitourinary:  Negative for difficulty urinating.  Musculoskeletal: Negative.  Negative for arthralgias and myalgias.  Skin:  Positive for color change and rash.  Neurological: Negative.  Negative for dizziness and weakness.  Hematological:  Negative for adenopathy. Does not bruise/bleed easily.  Psychiatric/Behavioral: Negative.      Objective:  BP 136/82 (BP Location: Left Arm, Patient Position: Sitting, Cuff Size: Normal)   Pulse 69   Temp 98 F (36.7 C) (Oral)   Ht 5\' 6"  (1.676 m)   Wt 232 lb (105.2 kg)   SpO2 97%   BMI 37.45 kg/m   BP Readings from Last 3 Encounters:  09/04/23 136/82  03/04/23 136/82  12/03/22 (!) 148/88    Wt Readings from Last 3 Encounters:  09/04/23 232 lb (105.2 kg)  03/04/23 244 lb 2 oz (110.7 kg)  12/03/22 241 lb (109.3 kg)    Physical Exam Vitals reviewed.  Constitutional:      Appearance: Normal appearance.  HENT:     Nose: Nose normal.     Mouth/Throat:     Mouth: Mucous membranes are moist.  Eyes:     General: No scleral icterus.    Conjunctiva/sclera: Conjunctivae normal.  Cardiovascular:     Rate and Rhythm: Regular rhythm. Bradycardia present.     Heart sounds: No  murmur heard.    No friction rub. No gallop.     Comments: EKG--- SB 59 bpm +LVH with early repol No Q waves Pulmonary:     Effort: Pulmonary effort is normal.     Breath sounds: No stridor. No wheezing, rhonchi or rales.  Abdominal:     General: Abdomen is flat.     Palpations: There is no mass.     Tenderness: There is no abdominal tenderness. There is no guarding.     Hernia: No hernia is present.  Musculoskeletal:     Right lower leg: No edema.     Left lower leg: No edema.  Skin:    Findings: Lesion and rash present. No erythema.  Neurological:     General: No focal deficit present.     Mental Status: He is alert. Mental status is at baseline.  Psychiatric:        Mood and Affect: Mood normal.        Behavior: Behavior normal.     Lab Results  Component Value Date   WBC 12.1 (H) 09/04/2023   HGB 14.6 09/04/2023   HCT 44.9 09/04/2023   PLT 193.0 09/04/2023   GLUCOSE 100 (H) 09/04/2023   CHOL 150 03/04/2023   TRIG 190.0 (H) 03/04/2023   HDL 45.80 03/04/2023   LDLDIRECT 120.0 11/17/2015   LDLCALC 66 03/04/2023   ALT 25 09/04/2023   AST 22 09/04/2023   NA 136 09/04/2023   K 3.3 (L) 09/04/2023   CL 99 09/04/2023   CREATININE 1.11 09/04/2023   BUN 18 09/04/2023   CO2 29 09/04/2023   TSH 1.48 12/03/2022   PSA 0.46 12/03/2022   HGBA1C 6.3 09/04/2023   MICROALBUR <0.7 09/04/2023    CT CARDIAC SCORING (SELF PAY ONLY) Addendum Date: 12/21/2021 ADDENDUM REPORT: 12/21/2021 17:55 CLINICAL DATA:  Cardiovascular Disease Risk stratification EXAM: Coronary Calcium  Score TECHNIQUE: A gated, non-contrast computed tomography scan of the heart was performed using 3mm slice thickness. Axial images were analyzed on a dedicated workstation. Calcium  scoring of the coronary arteries was performed using the Agatston method. FINDINGS: Coronary arteries: Normal origins. Coronary Calcium  Score: Total: 0 Pericardium: Normal. Ascending Aorta: Normal caliber. Non-cardiac: See separate  report from [facility]. IMPRESSION: Coronary calcium  score of 0. RECOMMENDATIONS: Coronary artery calcium  (CAC) score is a strong predictor of incident coronary heart disease (CHD) and provides predictive information beyond traditional risk factors. CAC scoring is reasonable to use in the decision to withhold, postpone, or initiate statin therapy in intermediate-risk or selected borderline-risk asymptomatic adults (age 6-75 years and LDL-C >=70 to <190 mg/dL) who do not have diabetes or established atherosclerotic cardiovascular disease (ASCVD).* In intermediate-risk (10-year ASCVD risk >=7.5% to <20%) adults or selected borderline-risk (10-year ASCVD risk >=5% to <7.5%) adults in whom a CAC score is measured for the purpose of making a  treatment decision the following recommendations have been made: If CAC=0, it is reasonable to withhold statin therapy and reassess in 5 to 10 years, as long as higher risk conditions are absent (diabetes mellitus, family history of premature CHD in first degree relatives (males <55 years; females <65 years), cigarette smoking, or LDL >=190 mg/dL). If CAC is 1 to 99, it is reasonable to initiate statin therapy for patients >=81 years of age. If CAC is >=100 or >=75th percentile, it is reasonable to initiate statin therapy at any age. Cardiology referral should be considered for patients with CAC scores >=400 or >=75th percentile. *2018 AHA/ACC/AACVPR/AAPA/ABC/ACPM/ADA/AGS/APhA/ASPC/NLA/PCNA Guideline on the Management of Blood Cholesterol: A Report of the American College of Cardiology/American Heart Association Task Force on Clinical Practice Guidelines. J Am Coll Cardiol. 2019;73(24):3168-3209. Electronically Signed   By: Dorothye Gathers M.D.   On: 12/21/2021 17:55   Result Date: 12/21/2021 EXAM: OVER-READ INTERPRETATION  CT CHEST The following report is a limited chest CT over-read performed by radiologist Dr. Janeece Mechanic of [facility] on 12/21/2021. This  over-read does not include interpretation of cardiac or coronary anatomy or pathology. The coronary calcium  score interpretation by the cardiologist is attached. COMPARISON:  None Available. FINDINGS: Vascular: Heart is normal size.  Aorta normal caliber. Mediastinum/Nodes: No adenopathy Lungs/Pleura: No confluent opacities or effusions Upper Abdomen: No acute findings Musculoskeletal: Chest wall soft tissues are unremarkable. No acute bony abnormality IMPRESSION: No acute or significant extracardiac abnormality. Electronically Signed: By: Janeece Mechanic M.D. On: 12/21/2021 17:12    Assessment & Plan:  Essential hypertension, benign -     Urinalysis, Routine w reflex microscopic; Future -     Hepatic function panel; Future -     CBC with Differential/Platelet; Future -     Basic metabolic panel with GFR; Future -     EKG 12-Lead -     Potassium Chloride  ER; Take 1 tablet (10 mEq total) by mouth 2 (two) times daily.  Dispense: 180 tablet; Refill: 0  Hypertensive left ventricular hypertrophy, without heart failure -     Urinalysis, Routine w reflex microscopic; Future -     Basic metabolic panel with GFR; Future -     Potassium Chloride  ER; Take 1 tablet (10 mEq total) by mouth 2 (two) times daily.  Dispense: 180 tablet; Refill: 0  Dyslipidemia, goal LDL below 100 -     Hepatic function panel; Future -     Rosuvastatin  Calcium ; Take 1 tablet (5 mg total) by mouth daily.  Dispense: 90 tablet; Refill: 0  Type 2 diabetes mellitus with other specified complication, without long-term current use of insulin (HCC) -     HM Diabetes Foot Exam -     Hemoglobin A1c; Future -     Basic metabolic panel with GFR; Future -     Microalbumin / creatinine urine ratio; Future  Intrinsic atopic dermatitis -     Zoryve; Apply 1 Act topically daily.  Dispense: 60 g; Refill: 2  Drug-induced erectile dysfunction -     Tadalafil ; Take 1 tablet (20 mg total) by mouth daily as needed.  Dispense: 4 tablet; Refill:  2  Diuretic-induced hypokalemia -     Potassium Chloride  ER; Take 1 tablet (10 mEq total) by mouth 2 (two) times daily.  Dispense: 180 tablet; Refill: 0        Follow-up: Return in about 3 months (around 12/04/2023).  Sandra Crouch, MD

## 2023-09-04 NOTE — Telephone Encounter (Signed)
 Pharmacy Patient Advocate Encounter  Received notification from [facility] that Prior Authorization for ZORYVE 0.15% EX CREAM  has been APPROVED from 09/04/2023 to 12/03/2023. Ran test claim, Copay is $200.00. This test claim was processed through [facility]- copay amounts may vary at other pharmacies due to pharmacy/plan contracts, or as the patient moves through the different stages of their insurance plan.   PA #/Case ID/Reference #: [id number] SS

## 2023-09-04 NOTE — Telephone Encounter (Signed)
 Pharmacy Patient Advocate Encounter   Received notification from CoverMyMeds that prior authorization for Zoryve 0.15% cream is required/requested.   Insurance verification completed.   The patient is insured through [facility] .   Per test claim: PA required; PA submitted to above mentioned insurance via CoverMyMeds Key/confirmation #/EOC [id number] Status is pending

## 2023-09-04 NOTE — Patient Instructions (Signed)
 Atopic Dermatitis  "Dermatitis" means inflammation of the skin.  "Atopic" dermatitis is a particular type of skin inflammation that is marked by dryness, associated itching, and a characteristic pattern of rash on the body.  The condition is fairly common and may occur in as many as 10% of children.  You will often hear it called "atopic eczema" or sometimes just "eczema".  The exact cause of atopic dermatitis is unknown.  In many patients, there is a family history of hay fever, asthma, or atopic dermatitis itself.  Rarely, atopic dermatitis in infants may be related to food sensitivity, such as sensitivity to milk, but this is often difficult to determine and manage.  In the majority of cases, however, no allergic triggers can be found.  Physical or emotional stressors (severe seasonal allergies, physical illness, etc.) can worsen atopic dermatitis.  Atopic dermatitis usually starts in infancy from the ages of 2 to 6 months.  The skin is dry and the rash is quite itchy, so infants may be restless and rub against the sheets or scratch (if able).  The rash may involve the face or it may cover a large part of the body.  As the child gets older, the rash may become more localized.  In early childhood, the rash is commonly on the legs, feet, hands and arms.  As a child becomes older, the rash may be limited to the bend of the elbows, knees, on the back of the hands, feet, and on the neck and face.  When the rash becomes more established, the dry itchy skin may become thickened, leathery and sometimes darker in coloration.  The more the person scratches, the worse the rash is and the thicker the skin gets.  Many children with atopic dermatitis outgrow the condition before school age, while others continue to have problems into adolescence and adulthood.  Many things may affect the severity of the condition.  All patients have sensitive and dry skin.  Many will find that during the winter months when the humidity  is very low, the dryness and itchiness will be worse.  On the other hand, some people are easily irritated by sweat and will find that they have more problems during the summer months.  Most patients note an increase in itching at times when there are sudden changes in temperature.  Other irritants easily affect the skin of a patient with atopic dermatitis.  Use of harsh soaps or detergents and exposure to wool are common problems.  Sometimes atopic dermatitis may become infected by bacteria, yeast or viruses.  This is called "secondary infection".  Bacterial secondary infection is the most common and is often a result of scratching.  The rash gets very red with pus-filled pimples and scabs.  If this occurs, your doctor will prescribe an antibiotic to control the infection.  A more serious complication can be caused by certain viruses.  The "cold sore" virus (herpes simplex) may cause a severe rash.  If this is suspected, immediately contact your doctor.   What can I expect from treatment? Unfortunately, there is no "magic" cure that will always eliminate atopic dermatitis.  The main objective in treating atopic dermatitis is to decrease the skin eruption and relieve the itching.  There are a number of different forms of the medications that are used for atopic dermatitis.  Primarily, topical medications will be used.  Because the skin is excessively dry, moisturizers will be recommended that will effectively decrease the dryness.  Daily bathing is  a useful way to get water into the skin but bathing should be brief (no more than 10 minutes unless otherwise indicated by your physician).  Effective moisturizers (Cetaphil cream or lotion, CeraVe cream or lotion [[employer], [facility], and [facility]], Aquaphor, and plain Vaseline) can be used immediately after the bath or shower to trap moisture within the skin.  It is best to "pat dry" after a bathing and then place your moisturizer (cream or lotion) on your skin.   Cortisone (steroid) is a medicated ointment or cream (eg. triamcinolone, hydrocortisone, desonide, betamethasone, clobetasol) that may also be suggested.  It is very helpful in decreasing the itching and controlling the inflammation.  Your doctor will prescribe a cortisone treatment that is most appropriate for the severity and location of the dermatitis that is to be treated.    Once the affected area clears up, it is best to discontinue the use of the cortisone preparation due to possibility of atrophy (skin thinning), but continue the regular use of moisturizers to try to prevent new areas of dermatitis from occurring.  Of course, if itching or a new rash begins, the cortisone preparation may have to be started again.  Anti-inflammatory creams and ointments which are not steroids such as Protopic and Elidel may also be prescribed.  Certain internal medicines called antihistamines (eg. Atarax, Benadryl, hydroxyzine) may help control itching.  They primarily help with the itching by introducing some drowsiness and allowing you to sleep at night.  Some oral antibiotics are often useful as well for controlling the secondary infection and enable infected dermatitis to be controlled.  Other important forms of treatment: Avoid contact with substances you know to cause itching.  These may include soaps, detergents, certain perfumes, dust, grass, weeds, wools, and other types of scratchy clothing. You may bathe daily.  Use no soap or the minimal amount necessary to get clean.  Always use moisturizer immediately after bathing (within 3 minutes is best).  Avoid very hot or very cold water.  Avoid bubble baths.  When drying with a towel, pat dry and do not rub. Use a mild, unscented soap (Dove, CeraVe Cleanser, Lever 2000, or Cetaphil). Try to keep the temperature and humidity in the home fairly constant.  Use a bedroom air conditioner in the summer and a humidifier in the winter.  It is very important that the  humidifier be cleaned frequently and thoroughly since mold may grow and cause allergies. Try to avoid scratching.  Atopic dermatitis is often called "the itch that rashes" and it is known that scratching plays a significant role in making atopic dermatitis worse.  Keeping the nails short and well-filed is helpful. Use a fragrance-free, sensitive skin laundry detergent (eg. All Free & Clear).  Run clothes through a second rinse cycle to remove any residual detergents and chemicals.  Bed linens and towels should be washed in hot water to kill dust mites, which are common allergen in atopic patients. In the bedroom, minimize rugs and curtains or other loose fabrics that collect dust.  The National Eczema Association (www.eczema-assn.org) is a wonderful organization that sends out a Dealer with useful information on these types of conditions. Please consider contacting them at the above website or by address: National Eczema Association for Science and Education, [address], [address], [place] , [address]

## 2023-10-07 ENCOUNTER — Other Ambulatory Visit: Payer: Self-pay | Admitting: Internal Medicine

## 2023-10-07 DIAGNOSIS — I10 Essential (primary) hypertension: Secondary | ICD-10-CM

## 2023-10-13 ENCOUNTER — Other Ambulatory Visit: Payer: Self-pay | Admitting: Internal Medicine

## 2023-10-13 DIAGNOSIS — K2100 Gastro-esophageal reflux disease with esophagitis, without bleeding: Secondary | ICD-10-CM

## 2023-11-20 LAB — HM DIABETES EYE EXAM

## 2023-12-01 ENCOUNTER — Other Ambulatory Visit: Payer: Self-pay | Admitting: Internal Medicine

## 2023-12-01 DIAGNOSIS — E876 Hypokalemia: Secondary | ICD-10-CM

## 2023-12-01 DIAGNOSIS — I10 Essential (primary) hypertension: Secondary | ICD-10-CM

## 2023-12-01 DIAGNOSIS — I119 Hypertensive heart disease without heart failure: Secondary | ICD-10-CM

## 2023-12-18 ENCOUNTER — Encounter: Payer: Self-pay | Admitting: Internal Medicine

## 2023-12-18 ENCOUNTER — Ambulatory Visit (INDEPENDENT_AMBULATORY_CARE_PROVIDER_SITE_OTHER): Admitting: Internal Medicine

## 2023-12-18 VITALS — BP 128/76 | HR 64 | Temp 98.7°F | Resp 16 | Ht 66.0 in | Wt 229.0 lb

## 2023-12-18 DIAGNOSIS — I10 Essential (primary) hypertension: Secondary | ICD-10-CM

## 2023-12-18 DIAGNOSIS — Z0001 Encounter for general adult medical examination with abnormal findings: Secondary | ICD-10-CM

## 2023-12-18 DIAGNOSIS — R21 Rash and other nonspecific skin eruption: Secondary | ICD-10-CM | POA: Diagnosis not present

## 2023-12-18 DIAGNOSIS — E1169 Type 2 diabetes mellitus with other specified complication: Secondary | ICD-10-CM | POA: Diagnosis not present

## 2023-12-18 DIAGNOSIS — E785 Hyperlipidemia, unspecified: Secondary | ICD-10-CM

## 2023-12-18 DIAGNOSIS — R300 Dysuria: Secondary | ICD-10-CM

## 2023-12-18 DIAGNOSIS — K2100 Gastro-esophageal reflux disease with esophagitis, without bleeding: Secondary | ICD-10-CM

## 2023-12-18 DIAGNOSIS — Z125 Encounter for screening for malignant neoplasm of prostate: Secondary | ICD-10-CM | POA: Diagnosis not present

## 2023-12-18 DIAGNOSIS — E876 Hypokalemia: Secondary | ICD-10-CM

## 2023-12-18 DIAGNOSIS — G4733 Obstructive sleep apnea (adult) (pediatric): Secondary | ICD-10-CM

## 2023-12-18 DIAGNOSIS — T502X5A Adverse effect of carbonic-anhydrase inhibitors, benzothiadiazides and other diuretics, initial encounter: Secondary | ICD-10-CM

## 2023-12-18 LAB — CBC WITH DIFFERENTIAL/PLATELET
Basophils Absolute: 0 K/uL (ref 0.0–0.1)
Basophils Relative: 0.4 % (ref 0.0–3.0)
Eosinophils Absolute: 0.1 K/uL (ref 0.0–0.7)
Eosinophils Relative: 1.5 % (ref 0.0–5.0)
HCT: 46.1 % (ref 39.0–52.0)
Hemoglobin: 15 g/dL (ref 13.0–17.0)
Lymphocytes Relative: 21.4 % (ref 12.0–46.0)
Lymphs Abs: 2 K/uL (ref 0.7–4.0)
MCHC: 32.6 g/dL (ref 30.0–36.0)
MCV: 90.1 fl (ref 78.0–100.0)
Monocytes Absolute: 0.8 K/uL (ref 0.1–1.0)
Monocytes Relative: 8.4 % (ref 3.0–12.0)
Neutro Abs: 6.2 K/uL (ref 1.4–7.7)
Neutrophils Relative %: 68.3 % (ref 43.0–77.0)
Platelets: 183 K/uL (ref 150.0–400.0)
RBC: 5.12 Mil/uL (ref 4.22–5.81)
RDW: 14.3 % (ref 11.5–15.5)
WBC: 9.1 K/uL (ref 4.0–10.5)

## 2023-12-18 LAB — BASIC METABOLIC PANEL WITH GFR
BUN: 15 mg/dL (ref 6–23)
CO2: 31 meq/L (ref 19–32)
Calcium: 9.5 mg/dL (ref 8.4–10.5)
Chloride: 99 meq/L (ref 96–112)
Creatinine, Ser: 1.2 mg/dL (ref 0.40–1.50)
GFR: 69.22 mL/min (ref >60.00)
Glucose, Bld: 89 mg/dL (ref 70–99)
Potassium: 4.3 meq/L (ref 3.5–5.1)
Sodium: 138 meq/L (ref 135–145)

## 2023-12-18 LAB — URINALYSIS, ROUTINE W REFLEX MICROSCOPIC
Bilirubin Urine: NEGATIVE
Hgb urine dipstick: NEGATIVE
Ketones, ur: NEGATIVE
Leukocytes,Ua: NEGATIVE
Nitrite: NEGATIVE
RBC / HPF: NONE SEEN (ref 0–?)
Specific Gravity, Urine: <=1.005 — AB (ref 1.000–1.030)
Total Protein, Urine: NEGATIVE
Urine Glucose: NEGATIVE
Urobilinogen, UA: 0.2 (ref 0.0–1.0)
WBC, UA: NONE SEEN (ref 0–?)
pH: 7 (ref 5.0–8.0)

## 2023-12-18 LAB — HEMOGLOBIN A1C: Hgb A1c MFr Bld: 6.6 % — ABNORMAL HIGH (ref 4.6–6.5)

## 2023-12-18 LAB — LIPID PANEL
Cholesterol: 154 mg/dL (ref 0–200)
HDL: 51.1 mg/dL (ref >39.00)
LDL Cholesterol: 89 mg/dL (ref 0–99)
NonHDL: 103.38
Total CHOL/HDL Ratio: 3
Triglycerides: 73 mg/dL (ref 0.0–149.0)
VLDL: 14.6 mg/dL (ref 0.0–40.0)

## 2023-12-18 LAB — MAGNESIUM: Magnesium: 2 mg/dL (ref 1.5–2.5)

## 2023-12-18 LAB — TSH: TSH: 0.88 u[IU]/mL (ref 0.35–5.50)

## 2023-12-18 LAB — PSA: PSA: 0.62 ng/mL (ref 0.10–4.00)

## 2023-12-18 MED ORDER — ROSUVASTATIN CALCIUM 10 MG PO TABS: 10.0000 mg | ORAL_TABLET | Freq: Every day | ORAL | 1 refills | Status: AC

## 2023-12-18 MED ORDER — NEBIVOLOL HCL 2.5 MG PO TABS: 2.5000 mg | ORAL_TABLET | Freq: Every day | ORAL | 1 refills | Status: AC

## 2023-12-18 NOTE — Patient Instructions (Signed)
 Health Maintenance, Male  Adopting a healthy lifestyle and getting preventive care are important in promoting health and wellness. Ask your health care provider about:  The right schedule for you to have regular tests and exams.  Things you can do on your own to prevent diseases and keep yourself healthy.  What should I know about diet, weight, and exercise?  Eat a healthy diet    Eat a diet that includes plenty of vegetables, fruits, low-fat dairy products, and lean protein.  Do not eat a lot of foods that are high in solid fats, added sugars, or sodium.  Maintain a healthy weight  Body mass index (BMI) is a measurement that can be used to identify possible weight problems. It estimates body fat based on height and weight. Your health care provider can help determine your BMI and help you achieve or maintain a healthy weight.  Get regular exercise  Get regular exercise. This is one of the most important things you can do for your health. Most adults should:  Exercise for at least 150 minutes each week. The exercise should increase your heart rate and make you sweat (moderate-intensity exercise).  Do strengthening exercises at least twice a week. This is in addition to the moderate-intensity exercise.  Spend less time sitting. Even light physical activity can be beneficial.  Watch cholesterol and blood lipids  Have your blood tested for lipids and cholesterol at 53 years of age, then have this test every 5 years.  You may need to have your cholesterol levels checked more often if:  Your lipid or cholesterol levels are high.  You are older than 53 years of age.  You are at high risk for heart disease.  What should I know about cancer screening?  Many types of cancers can be detected early and may often be prevented. Depending on your health history and family history, you may need to have cancer screening at various ages. This may include screening for:  Colorectal cancer.  Prostate cancer.  Skin cancer.  Lung  cancer.  What should I know about heart disease, diabetes, and high blood pressure?  Blood pressure and heart disease  High blood pressure causes heart disease and increases the risk of stroke. This is more likely to develop in people who have high blood pressure readings or are overweight.  Talk with your health care provider about your target blood pressure readings.  Have your blood pressure checked:  Every 3-5 years if you are 9-95 years of age.  Every year if you are 85 years old or older.  If you are between the ages of 29 and 29 and are a current or former smoker, ask your health care provider if you should have a one-time screening for abdominal aortic aneurysm (AAA).  Diabetes  Have regular diabetes screenings. This checks your fasting blood sugar level. Have the screening done:  Once every three years after age 23 if you are at a normal weight and have a low risk for diabetes.  More often and at a younger age if you are overweight or have a high risk for diabetes.  What should I know about preventing infection?  Hepatitis B  If you have a higher risk for hepatitis B, you should be screened for this virus. Talk with your health care provider to find out if you are at risk for hepatitis B infection.  Hepatitis C  Blood testing is recommended for:  Everyone born from [birth year] through 1965.  Anyone  with known risk factors for hepatitis C.  Sexually transmitted infections (STIs)  You should be screened each year for STIs, including gonorrhea and chlamydia, if:  You are sexually active and are younger than 53 years of age.  You are older than 53 years of age and your health care provider tells you that you are at risk for this type of infection.  Your sexual activity has changed since you were last screened, and you are at increased risk for chlamydia or gonorrhea. Ask your health care provider if you are at risk.  Ask your health care provider about whether you are at high risk for HIV. Your health care provider  may recommend a prescription medicine to help prevent HIV infection. If you choose to take medicine to prevent HIV, you should first get tested for HIV. You should then be tested every 3 months for as long as you are taking the medicine.  Follow these instructions at home:  Alcohol use  Do not drink alcohol if your health care provider tells you not to drink.  If you drink alcohol:  Limit how much you have to 0-2 drinks a day.  Know how much alcohol is in your drink. In the [place] one drink equals one 12 oz bottle of beer (355 mL), one 5 oz glass of wine (148 mL), or one 1 oz glass of hard liquor (44 mL).  Lifestyle  Do not use any products that contain nicotine or tobacco. These products include cigarettes, chewing tobacco, and vaping devices, such as e-cigarettes. If you need help quitting, ask your health care provider.  Do not use street drugs.  Do not share needles.  Ask your health care provider for help if you need support or information about quitting drugs.  General instructions  Schedule regular health, dental, and eye exams.  Stay current with your vaccines.  Tell your health care provider if:  You often feel depressed.  You have ever been abused or do not feel safe at home.  Summary  Adopting a healthy lifestyle and getting preventive care are important in promoting health and wellness.  Follow your health care provider's instructions about healthy diet, exercising, and getting tested or screened for diseases.  Follow your health care provider's instructions on monitoring your cholesterol and blood pressure.  This information is not intended to replace advice given to you by your health care provider. Make sure you discuss any questions you have with your health care provider.  Document Revised: 09/19/2020 Document Reviewed: 09/19/2020  Elsevier Patient Education  2024 [employer].

## 2023-12-18 NOTE — Progress Notes (Signed)
 Subjective:  Patient ID: Jacob KANDICE Butler Mickey., male    DOB: 04/28/1971  Age: 53 y.o. MRN: [id number]  CC: Annual Exam, Diabetes, Hyperlipidemia, and Hypertension   HPI Jacob Cross. presents for a CPX and f/up ---   Discussed the use of AI scribe software for clinical note transcription with the patient, who gave verbal consent to proceed.  History of Present Illness Jacob Boy. is a 53 year old male with hypertension and spinal stenosis who presents for a routine follow-up visit.  He feels generally well and remains active without experiencing chest pain, shortness of breath, or dizziness during physical activity. He acknowledges being 'a little out of shape' but is actively working on improving his fitness. He notes weight loss, which he attributes to eating better and working out, and denies any recent weight gain.  Regarding his spinal stenosis, his back pain has improved. No significant heartburn or indigestion recently.  He reports no symptoms of high blood sugar such as excessive thirst or urination. He is not taking metformin  as his A1c levels have been stable, allowing him to manage his condition through lifestyle changes.  He is not taking indapamide  but continues to take rosuvastatin , nebivolol , and Nexium . He takes a potassium supplement but occasionally forgets doses, usually managing to take at least one dose per day.    Outpatient Medications Prior to Visit  Medication Sig Dispense Refill   esomeprazole  (NEXIUM ) 40 MG capsule TAKE 1 CAPSULE BY MOUTH EVERY DAY 90 capsule 0   fluticasone  (FLONASE ) 50 MCG/ACT nasal spray Place 2 sprays into both nostrils daily. 16 g 6   Roflumilast  (ZORYVE ) 0.15 % CREA Apply 1 Act topically daily. 60 g 2   tadalafil  (CIALIS ) 20 MG tablet Take 1 tablet (20 mg total) by mouth daily as needed. 4 tablet 2   nebivolol  (BYSTOLIC ) 2.5 MG tablet TAKE 1 TABLET BY MOUTH EVERY DAY 90 tablet 0   potassium chloride  (KLOR-CON ) 10 MEQ tablet  TAKE 1 TABLET BY MOUTH 2 TIMES DAILY. 180 tablet 0   rosuvastatin  (CRESTOR ) 5 MG tablet Take 1 tablet (5 mg total) by mouth daily. 90 tablet 0   indapamide  (LOZOL ) 1.25 MG tablet TAKE 1 TABLET BY MOUTH DAILY. (Patient not taking: Reported on 03/04/2023) 90 tablet 0   metFORMIN  (GLUCOPHAGE -XR) 750 MG 24 hr tablet TAKE 1 TABLET BY MOUTH EVERY DAY WITH BREAKFAST (Patient not taking: Reported on 09/04/2023) 90 tablet 0   No facility-administered medications prior to visit.    ROS Review of Systems  Constitutional:  Negative for appetite change, chills, diaphoresis, fatigue and fever.  HENT: Negative.    Eyes: Negative.   Respiratory:  Positive for apnea. Negative for cough, chest tightness, shortness of breath and wheezing.   Cardiovascular:  Negative for chest pain, palpitations and leg swelling.  Gastrointestinal: Negative.  Negative for abdominal pain, constipation, diarrhea, nausea and vomiting.  Endocrine: Negative.   Genitourinary:  Positive for dysuria. Negative for difficulty urinating, genital sores, penile swelling, scrotal swelling, testicular pain and urgency.  Musculoskeletal: Negative.  Negative for arthralgias and myalgias.  Skin:  Positive for rash. Negative for color change.  Neurological: Negative.  Negative for dizziness and weakness.  Hematological:  Negative for adenopathy. Does not bruise/bleed easily.  Psychiatric/Behavioral: Negative.      Objective:  BP 128/76 (BP Location: Left Arm, Patient Position: Sitting, Cuff Size: Large)   Pulse 64   Temp 98.7 F (37.1 C) (Oral)   Resp 16  Ht 5' 6 (1.676 m)   Wt 229 lb (103.9 kg)   SpO2 96%   BMI 36.96 kg/m   BP Readings from Last 3 Encounters:  12/18/23 128/76  09/04/23 136/82  03/04/23 136/82    Wt Readings from Last 3 Encounters:  12/18/23 229 lb (103.9 kg)  09/04/23 232 lb (105.2 kg)  03/04/23 244 lb 2 oz (110.7 kg)    Physical Exam Vitals reviewed.  Constitutional:      Appearance: Normal  appearance.  HENT:     Nose: Nose normal.     Mouth/Throat:     Mouth: Mucous membranes are moist.  Eyes:     General: No scleral icterus.    Conjunctiva/sclera: Conjunctivae normal.  Cardiovascular:     Rate and Rhythm: Normal rate and regular rhythm.     Heart sounds: No murmur heard.    No friction rub. No gallop.  Pulmonary:     Effort: Pulmonary effort is normal.     Breath sounds: No stridor. No wheezing, rhonchi or rales.  Abdominal:     General: Abdomen is flat.     Palpations: There is no mass.     Tenderness: There is no abdominal tenderness. There is no guarding.     Hernia: No hernia is present. There is no hernia in the left inguinal area or right inguinal area.  Genitourinary:    Pubic Area: No rash.      Penis: Normal and circumcised.      Testes: Normal.     Epididymis:     Right: Normal.     Left: Normal.     Prostate: Enlarged. Not tender and no nodules present.     Rectum: Normal. Guaiac result negative. No mass, tenderness, anal fissure, external hemorrhoid or internal hemorrhoid. Normal anal tone.  Musculoskeletal:        General: Normal range of motion.     Cervical back: Neck supple.     Right lower leg: No edema.     Left lower leg: No edema.  Lymphadenopathy:     Cervical: No cervical adenopathy.     Lower Body: No right inguinal adenopathy. No left inguinal adenopathy.  Skin:    General: Skin is warm and dry.     Findings: Rash present. Rash is papular and scaling. Rash is not vesicular.  Neurological:     General: No focal deficit present.     Mental Status: He is alert.  Psychiatric:        Mood and Affect: Mood normal.        Behavior: Behavior normal.     Lab Results  Component Value Date   WBC 9.1 12/18/2023   HGB 15.0 12/18/2023   HCT 46.1 12/18/2023   PLT 183.0 12/18/2023   GLUCOSE 89 12/18/2023   CHOL 154 12/18/2023   TRIG 73.0 12/18/2023   HDL 51.10 12/18/2023   LDLDIRECT 120.0 11/17/2015   LDLCALC 89 12/18/2023   ALT 25  09/04/2023   AST 22 09/04/2023   NA 138 12/18/2023   K 4.3 12/18/2023   CL 99 12/18/2023   CREATININE 1.20 12/18/2023   BUN 15 12/18/2023   CO2 31 12/18/2023   TSH 0.88 12/18/2023   PSA 0.62 12/18/2023   HGBA1C 6.6 (H) 12/18/2023   MICROALBUR <0.7 09/04/2023    CT CARDIAC SCORING (SELF PAY ONLY) Addendum Date: 12/21/2021 ADDENDUM REPORT: 12/21/2021 17:55 CLINICAL DATA:  Cardiovascular Disease Risk stratification EXAM: Coronary Calcium  Score TECHNIQUE: A gated, non-contrast computed tomography  scan of the heart was performed using 3mm slice thickness. Axial images were analyzed on a dedicated workstation. Calcium  scoring of the coronary arteries was performed using the Agatston method. FINDINGS: Coronary arteries: Normal origins. Coronary Calcium  Score: Total: 0 Pericardium: Normal. Ascending Aorta: Normal caliber. Non-cardiac: See separate report from [facility]. IMPRESSION: Coronary calcium  score of 0. RECOMMENDATIONS: Coronary artery calcium  (CAC) score is a strong predictor of incident coronary heart disease (CHD) and provides predictive information beyond traditional risk factors. CAC scoring is reasonable to use in the decision to withhold, postpone, or initiate statin therapy in intermediate-risk or selected borderline-risk asymptomatic adults (age 42-75 years and LDL-C >=70 to <190 mg/dL) who do not have diabetes or established atherosclerotic cardiovascular disease (ASCVD).* In intermediate-risk (10-year ASCVD risk >=7.5% to <20%) adults or selected borderline-risk (10-year ASCVD risk >=5% to <7.5%) adults in whom a CAC score is measured for the purpose of making a treatment decision the following recommendations have been made: If CAC=0, it is reasonable to withhold statin therapy and reassess in 5 to 10 years, as long as higher risk conditions are absent (diabetes mellitus, family history of premature CHD in first degree relatives (males <55 years; females <65 years), cigarette  smoking, or LDL >=190 mg/dL). If CAC is 1 to 99, it is reasonable to initiate statin therapy for patients >=75 years of age. If CAC is >=100 or >=75th percentile, it is reasonable to initiate statin therapy at any age. Cardiology referral should be considered for patients with CAC scores >=400 or >=75th percentile. *2018 AHA/ACC/AACVPR/AAPA/ABC/ACPM/ADA/AGS/APhA/ASPC/NLA/PCNA Guideline on the Management of Blood Cholesterol: A Report of the American College of Cardiology/American Heart Association Task Force on Clinical Practice Guidelines. J Am Coll Cardiol. 2019;73(24):3168-3209. Electronically Signed   By: Oneil Parchment M.D.   On: 12/21/2021 17:55   Result Date: 12/21/2021 EXAM: OVER-READ INTERPRETATION  CT CHEST The following report is a limited chest CT over-read performed by radiologist Dr. Franky Crease of [facility] on 12/21/2021. This over-read does not include interpretation of cardiac or coronary anatomy or pathology. The coronary calcium  score interpretation by the cardiologist is attached. COMPARISON:  None Available. FINDINGS: Vascular: Heart is normal size.  Aorta normal caliber. Mediastinum/Nodes: No adenopathy Lungs/Pleura: No confluent opacities or effusions Upper Abdomen: No acute findings Musculoskeletal: Chest wall soft tissues are unremarkable. No acute bony abnormality IMPRESSION: No acute or significant extracardiac abnormality. Electronically Signed: By: Franky Crease M.D. On: 12/21/2021 17:12    Assessment & Plan:   Essential hypertension, benign- BP is well controlled. -     Basic metabolic panel with GFR; Future -     CBC with Differential/Platelet; Future -     TSH; Future -     Urinalysis, Routine w reflex microscopic; Future -     Nebivolol  HCl; Take 1 tablet (2.5 mg total) by mouth daily.  Dispense: 90 tablet; Refill: 1  Diuretic-induced hypokalemia -     Basic metabolic panel with GFR; Future -     Magnesium ; Future  Dyslipidemia, goal LDL below 100- LDL  goal achieved. Doing well on the statin  -     Lipid panel; Future -     Rosuvastatin  Calcium ; Take 1 tablet (10 mg total) by mouth daily.  Dispense: 90 tablet; Refill: 1  Type 2 diabetes mellitus with other specified complication, without long-term current use of insulin (HCC)- Blood sugar is well controlled. -     Hemoglobin A1c; Future  Gastroesophageal reflux disease with esophagitis without hemorrhage -  CBC with Differential/Platelet; Future  Encounter for general adult medical examination with abnormal findings- Exam completed, labs reviewed, vaccines reviewed, cancer screenings addressed, pt ed material was given.  -     PSA; Future -     HIV Antibody (routine testing w rflx); Future  Dysuria -     Chlamydia/Gonococcus/Trichomonas, NAA; Future -     Urinalysis, Routine w reflex microscopic; Future  Rash -     HIV Antibody (routine testing w rflx); Future -     RPR; Future  OSA (obstructive sleep apnea) -     Pulmonary Visit     Follow-up: Return in about 6 months (around 06/19/2024).  Debby Molt, MD

## 2023-12-19 LAB — RPR: RPR Ser Ql: NONREACTIVE

## 2023-12-19 LAB — HIV ANTIBODY (ROUTINE TESTING W REFLEX): HIV 1&2 Ab, 4th Generation: NONREACTIVE

## 2023-12-20 ENCOUNTER — Ambulatory Visit: Payer: Self-pay | Admitting: Internal Medicine

## 2023-12-21 ENCOUNTER — Other Ambulatory Visit: Payer: Self-pay | Admitting: Internal Medicine

## 2023-12-21 DIAGNOSIS — E785 Hyperlipidemia, unspecified: Secondary | ICD-10-CM

## 2023-12-22 LAB — CHLAMYDIA/GONOCOCCUS/TRICHOMONAS, NAA
Chlamydia by NAA: NEGATIVE
Gonococcus by NAA: NEGATIVE
Trich vag by NAA: NEGATIVE

## 2024-01-12 ENCOUNTER — Other Ambulatory Visit: Payer: Self-pay | Admitting: Internal Medicine

## 2024-01-12 DIAGNOSIS — K2100 Gastro-esophageal reflux disease with esophagitis, without bleeding: Secondary | ICD-10-CM

## 2024-03-19 ENCOUNTER — Ambulatory Visit (INDEPENDENT_AMBULATORY_CARE_PROVIDER_SITE_OTHER): Admitting: Pulmonary Disease

## 2024-03-19 ENCOUNTER — Encounter (HOSPITAL_BASED_OUTPATIENT_CLINIC_OR_DEPARTMENT_OTHER): Payer: Self-pay | Admitting: Pulmonary Disease

## 2024-03-19 VITALS — BP 154/85 | HR 66 | Ht 66.0 in | Wt 233.0 lb

## 2024-03-19 DIAGNOSIS — G4733 Obstructive sleep apnea (adult) (pediatric): Secondary | ICD-10-CM

## 2024-03-19 DIAGNOSIS — G4726 Circadian rhythm sleep disorder, shift work type: Secondary | ICD-10-CM

## 2024-03-19 NOTE — Progress Notes (Signed)
 Epworth Sleepiness Scale  Use the following scale to choose the most appropriate number for each situation. 0 Would never nod off 1  Slight  chance of nodding off 2 Moderate chance of nodding off 3 High chance of nodding off  Sitting and reading: 2 Watching TV: 2 Sitting, inactive, in a public place (e.g., in a meeting, theater, or dinner event): 1 As a passenger in a car for an hour or more without stopping for a break: 0 Lying down to rest when circumstances permit:2 Sitting and talking to someone: 0 Sitting quietly after a meal without alcohol: 1 In a car, while stopped for a few  minutes in traffic or at a light: 1  TOTOAL: 9

## 2024-03-19 NOTE — Patient Instructions (Signed)
  VISIT SUMMARY: You were seen today for difficulty sleeping and issues with your CPAP machine. You have a history of obstructive sleep apnea and work night shifts, which may be contributing to your sleep problems. We discussed your current treatment and potential adjustments to improve your sleep quality.  YOUR PLAN: -OBSTRUCTIVE SLEEP APNEA: Obstructive sleep apnea is a condition where your airway becomes blocked during sleep, causing breathing interruptions. We discussed the importance of using your CPAP machine to keep your airway open, and we ordered a home sleep test to reassess the severity of your condition. You were given a prescription for a new CPAP machine and mask, and we talked about the benefits of weight loss and alternative mask options.  -CIRCADIAN RHYTHM SLEEP DISORDER, SHIFT WORK TYPE: Circadian rhythm sleep disorder is a condition where your sleep-wake cycle is disrupted, often due to working night shifts. We recommended trying melatonin to help you fall asleep and maintain sleep. We also advised you to keep a consistent sleep schedule and reduce light exposure during your sleep periods.  INSTRUCTIONS: Please follow up after completing the home sleep test. Continue using your CPAP machine with the new mask and consider weight loss to help improve your sleep apnea. Try taking melatonin as needed to help with sleep onset and maintain a consistent sleep schedule. Send Rx for CPAP supplies incl new airfit F30 FF mask to DME                      Contains text generated by Abridge.                                 Contains text generated by Abridge.

## 2024-03-19 NOTE — Progress Notes (Signed)
 New Patient Pulmonology Office Visit   Subjective:  Patient ID: Jacob Smyers., male    DOB: [birth year]  MRN: [id number]  Referred by: Joshua Debby CROME, MD  CC:  Chief Complaint  Patient presents with  . Establish Care    New Sleep     53 yo presents to establish care for OSA Night shift worker x 7y  Discussed the use of AI scribe software for clinical note transcription with the patient, who gave verbal consent to proceed.  History of Present Illness Jacob Steen. is a 53 year old male with obstructive sleep apnea who presents with difficulty sleeping and ineffective CPAP use. He was referred by Dr. Joshua for evaluation of his sleep issues.  He was diagnosed with obstructive sleep apnea in 2015 with an AHI of 14 events per hour. CPAP therapy was initiated but is challenging due to discomfort and mask dislodgement. He has tried multiple full-face masks but not a nasal mask. A home sleep test in 2019 indicated mild OSA with an AHI of 8-9 events per hour and oxygen saturation dropping to 76%. He has gained 20 pounds over the last decade.  He is a light sleeper with difficulty achieving deep sleep and frequent awakenings, making it hard to return to sleep. These issues have persisted for over a year and have worsened recently. His responsibilities caring for his parents may contribute to sleep disturbances.  He works night shifts from 11 PM to 7 AM and maintains this schedule even on days off, sleeping from 11 AM to 6 PM. It takes 30-45 minutes to fall asleep, and he wakes up a couple of times during the day.  ESS9    Significant tests/ events reviewed   HST 06/2017 AHI 9/h, low sat 76% 03/2013 NPSG >>AHI of 14 events per hour, however these occurred almost exclusively during REM with a REM AHI of 55 events per hour   ROS  Constitutional: negative for anorexia, fevers and sweats  Eyes: negative for irritation, redness and visual disturbance  Ears, nose, mouth, throat,  and face: negative for earaches, epistaxis, nasal congestion and sore throat  Respiratory: negative for cough, dyspnea on exertion, sputum and wheezing  Cardiovascular: negative for chest pain, dyspnea, lower extremity edema, orthopnea, palpitations and syncope  Gastrointestinal: negative for abdominal pain, constipation, diarrhea, melena, nausea and vomiting  Genitourinary:negative for dysuria, frequency and hematuria  Hematologic/lymphatic: negative for bleeding, easy bruising and lymphadenopathy  Musculoskeletal:negative for arthralgias, muscle weakness and stiff joints  Neurological: negative for coordination problems, gait problems, headaches and weakness  Endocrine: negative for diabetic symptoms including polydipsia, polyuria and weight loss   Allergies: Patient has no known allergies.  Current Outpatient Medications:  .  esomeprazole  (NEXIUM ) 40 MG capsule, TAKE 1 CAPSULE BY MOUTH EVERY DAY, Disp: 90 capsule, Rfl: 0 .  fluticasone  (FLONASE ) 50 MCG/ACT nasal spray, Place 2 sprays into both nostrils daily., Disp: 16 g, Rfl: 6 .  nebivolol  (BYSTOLIC ) 2.5 MG tablet, Take 1 tablet (2.5 mg total) by mouth daily., Disp: 90 tablet, Rfl: 1 .  Roflumilast  (ZORYVE ) 0.15 % CREA, Apply 1 Act topically daily., Disp: 60 g, Rfl: 2 .  rosuvastatin  (CRESTOR ) 10 MG tablet, Take 1 tablet (10 mg total) by mouth daily., Disp: 90 tablet, Rfl: 1 .  tadalafil  (CIALIS ) 20 MG tablet, Take 1 tablet (20 mg total) by mouth daily as needed., Disp: 4 tablet, Rfl: 2 Past Medical History:  Diagnosis Date  . Allergy   .  Chest pain, atypical   . GERD (gastroesophageal reflux disease)   . HYPERTROPHY PROSTATE W/UR OBST & OTH LUTS    Past Surgical History:  Procedure Laterality Date  . HERNIA REPAIR    . TONSILLECTOMY     Family History  Problem Relation Age of Onset  . Hypertension Mother   . Diabetes Father   . Colon cancer Other        1st degree relative <60  . Diabetes Other        1st degree relative   . Hypertension Other   . Stroke Other        Male 1st degree relative <50   Social History   Socioeconomic History  . Marital status: Single    Spouse name: Not on file  . Number of children: 2  . Years of education: Not on file  . Highest education level: Associate degree: occupational, scientist, product/process development, or vocational program  Occupational History  . 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 Smoking status: Former    Current packs/day: 0.00    Average packs/day: 0.5 packs/day for 10.0 years (5.0 ttl pk-yrs)    Types: Cigarettes    Start date: 09/12/1999    Quit date: 09/11/2009    Years since quitting: 14.5  . Smokeless tobacco: Never  Substance and Sexual Activity  . Alcohol use: Yes    Comment: Occasional  . Drug use: No  . Sexual activity: Yes    Birth control/protection: Pill  Other Topics Concern  . Not on file  Social History Narrative   Regular Exercise -  YES         Social Drivers of Health   Financial Resource Strain: Low Risk  (12/17/2023)   Overall Financial Resource Strain (CARDIA)   . Difficulty of Paying Living Expenses: Not hard at all  Food Insecurity: No Food Insecurity (12/17/2023)   Hunger Vital Sign   . Worried About Programme Researcher, Broadcasting/film/video in the Last Year: Never true   . Ran Out of Food in the Last Year: Never true  Transportation Needs: No Transportation Needs (12/17/2023)   PRAPARE - Transportation   . Lack of Transportation (Medical): No   . Lack of Transportation (Non-Medical): No  Physical Activity: Sufficiently Active (12/17/2023)   Exercise Vital Sign   . Days of Exercise per Week: 4 days   . Minutes of Exercise per Session: 60 min  Stress: Stress Concern Present (12/17/2023)   [employer] of Occupational Health - Occupational Stress Questionnaire   . Feeling of Stress: To some extent  Social Connections: Moderately Isolated (12/17/2023)   Social Connection and Isolation Panel   . Frequency of Communication with Friends and Family: More than  three times a week   . Frequency of Social Gatherings with Friends and Family: Twice a week   . Attends Religious Services: 1 to 4 times per year   . Active Member of Clubs or Organizations: No   . Attends Banker Meetings: Not on file   . Marital Status: Never married  Catering Manager Violence: Not on file       Objective:  BP (!) 154/85   Pulse 66   Ht 5' 6 (1.676 m)   Wt 233 lb (105.7 kg)   SpO2 96%   BMI 37.61 kg/m    Physical Exam  Gen. Pleasant, obese, in no distress ENT - no lesions, no post nasal drip Neck: No JVD, no thyromegaly, no carotid bruits Lungs: no use  of accessory muscles, no dullness to percussion, decreased without rales or rhonchi  Cardiovascular: Rhythm regular, heart sounds  normal, no murmurs or gallops, no peripheral edema Musculoskeletal: No deformities, no cyanosis or clubbing , no tremors       Assessment & Plan:  Assessment and Plan Assessment & Plan Obstructive sleep apnea Mild obstructive sleep apnea with AHI of 14 events/hour initially diagnosed in 2015. Recent home sleep test in 2019 showed mild events with AHI of 8-9 events/hour and lowest oxygen saturation of 76%. Difficulty using CPAP due to mask discomfort and displacement. Weight gain of 20 pounds over the last 10 years may contribute to worsening of sleep apnea. CPAP remains the best option for management, with potential benefits of weight loss and alternative mask options. Discussed the role of CPAP in maintaining airway patency during sleep, reducing snoring, and improving sleep quality. Explained that CPAP does not aid in falling asleep but improves sleep quality once asleep. Discussed alternative treatments including dental mouth guard and implant, but these are not recommended at this time. - Ordered home sleep test to reassess severity of sleep apnea. - Provided prescription for new CPAP machine and mask, including a smaller full face mask with magnetic straps. -  Advised on weight loss as a potential method to improve sleep apnea. - Discussed alternative mask options and provided information on obtaining replacement filters. 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 Difficulty maintaining sleep due to night shift schedule and caregiving responsibilities for parents. Potential contribution of circadian rhythm disruption to sleep disturbances. Melatonin suggested as a natural supplement to aid in sleep onset and maintenance. Explained that melatonin mimics natural sleep patterns and can help with sleep onset, especially when body clock is disrupted. - Recommended trial of melatonin 5 mg as needed to aid sleep onset and maintenance. - Advised on maintaining consistent sleep schedule and minimizing light exposure during sleep periods.       No follow-ups on file.   Harden ROCKFORD Jude, MD

## 2024-03-19 NOTE — Addendum Note (Signed)
 Addended by: ALINDA WINN KIDD on: 03/19/2024 01:41 PM   Modules accepted: Orders

## 2024-03-31 ENCOUNTER — Encounter (HOSPITAL_BASED_OUTPATIENT_CLINIC_OR_DEPARTMENT_OTHER): Payer: Self-pay | Admitting: Pulmonary Disease

## 2024-03-31 NOTE — Telephone Encounter (Signed)
 I see referral but can't tell if went to [facility] or where?

## 2024-04-12 ENCOUNTER — Encounter (HOSPITAL_BASED_OUTPATIENT_CLINIC_OR_DEPARTMENT_OTHER)

## 2024-04-17 ENCOUNTER — Telehealth: Payer: Self-pay

## 2024-04-17 DIAGNOSIS — G4733 Obstructive sleep apnea (adult) (pediatric): Secondary | ICD-10-CM | POA: Diagnosis not present

## 2024-04-17 NOTE — Telephone Encounter (Signed)
 Date of Study: 04/12/24  Interpretation: Moderate OSA with AHI of 20.1/hr, O2 desaturation 10 min. O2 nadir 78%.   Plan: Per Dr Jude. CPAP already ordered.   Sammi Fredericks, MD.

## 2024-04-17 NOTE — Telephone Encounter (Signed)
ATC x1.  LMTCB. 

## 2024-04-20 ENCOUNTER — Telehealth: Payer: Self-pay | Admitting: *Deleted

## 2024-04-20 NOTE — Telephone Encounter (Signed)
 Called patient and gave results of sleep study. Advised an order for cpap therapy was placed early Nov. He probably missed call but gave local [employer] contact # for patient to call. He has f/u scheduled with Candis on 2/2. NFN   Copied from CRM [id number]. Topic: Clinical - Lab/Test Results >> Apr 17, 2024 12:24 PM Isabell A wrote: Reason for CRM: Patient returning phone call from [facility] for results - attempt to contact [facility], no answer.

## 2024-04-22 ENCOUNTER — Encounter (HOSPITAL_BASED_OUTPATIENT_CLINIC_OR_DEPARTMENT_OTHER): Payer: Self-pay | Admitting: Pulmonary Disease

## 2024-04-22 DIAGNOSIS — G4733 Obstructive sleep apnea (adult) (pediatric): Secondary | ICD-10-CM

## 2024-04-22 NOTE — Telephone Encounter (Signed)
 ATCx2 LVMTCB, Sending MyChart message per protocol as pt has been unable to be reached. NFN.

## 2024-04-22 NOTE — Telephone Encounter (Signed)
 Please advise on Settings for new machine I do not see it in note or sleep study, I am unable to pull DL for complaince

## 2024-04-24 ENCOUNTER — Other Ambulatory Visit: Payer: Self-pay | Admitting: Internal Medicine

## 2024-04-24 DIAGNOSIS — K2100 Gastro-esophageal reflux disease with esophagitis, without bleeding: Secondary | ICD-10-CM

## 2024-05-22 ENCOUNTER — Telehealth (HOSPITAL_BASED_OUTPATIENT_CLINIC_OR_DEPARTMENT_OTHER): Payer: Self-pay

## 2024-05-22 NOTE — Telephone Encounter (Signed)
 CMN received for CPAP supplies sent to [facility] signed by provider and faxed confirmation received

## 2024-05-24 ENCOUNTER — Other Ambulatory Visit: Payer: Self-pay | Admitting: Internal Medicine

## 2024-05-24 DIAGNOSIS — N522 Drug-induced erectile dysfunction: Secondary | ICD-10-CM

## 2024-06-15 ENCOUNTER — Ambulatory Visit (HOSPITAL_BASED_OUTPATIENT_CLINIC_OR_DEPARTMENT_OTHER)

## 2024-06-19 ENCOUNTER — Other Ambulatory Visit: Payer: Self-pay | Admitting: Internal Medicine

## 2024-06-19 DIAGNOSIS — E785 Hyperlipidemia, unspecified: Secondary | ICD-10-CM

## 2024-07-31 ENCOUNTER — Ambulatory Visit (HOSPITAL_BASED_OUTPATIENT_CLINIC_OR_DEPARTMENT_OTHER): Admitting: Pulmonary Disease
